# Patient Record
Sex: Female | Born: 1965 | Race: White | Hispanic: No | Marital: Married | Smoking: Former smoker
Health system: Southern US, Academic
[De-identification: ages and names within clinical notes are randomized; demographics above are authoritative.]

## PROBLEM LIST (undated history)

## (undated) DIAGNOSIS — M5126 Other intervertebral disc displacement, lumbar region: Secondary | ICD-10-CM

## (undated) DIAGNOSIS — C801 Malignant (primary) neoplasm, unspecified: Secondary | ICD-10-CM

## (undated) DIAGNOSIS — F32A Depression, unspecified: Secondary | ICD-10-CM

## (undated) DIAGNOSIS — F329 Major depressive disorder, single episode, unspecified: Secondary | ICD-10-CM

## (undated) DIAGNOSIS — F419 Anxiety disorder, unspecified: Secondary | ICD-10-CM

## (undated) DIAGNOSIS — F988 Other specified behavioral and emotional disorders with onset usually occurring in childhood and adolescence: Secondary | ICD-10-CM

## (undated) HISTORY — DX: Depression, unspecified: F32.A

## (undated) HISTORY — DX: Anxiety disorder, unspecified: F41.9

## (undated) HISTORY — DX: Other specified behavioral and emotional disorders with onset usually occurring in childhood and adolescence: F98.8

## (undated) HISTORY — DX: Other intervertebral disc displacement, lumbar region: M51.26

---
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
## 2013-05-31 ENCOUNTER — Inpatient Hospital Stay (HOSPITAL_COMMUNITY)
Admit: 2013-05-31 | Discharge: 2013-05-31 | Disposition: A | Discharge status: Home / Self Care | Payer: Self-pay | Admitting: Radiology

## 2016-02-03 ENCOUNTER — Inpatient Hospital Stay (HOSPITAL_COMMUNITY)
Admit: 2016-02-03 | Discharge: 2016-02-03 | Disposition: A | Discharge status: Home / Self Care | Payer: Self-pay | Admitting: Radiology

## 2017-02-05 ENCOUNTER — Inpatient Hospital Stay (HOSPITAL_COMMUNITY)
Admit: 2017-02-05 | Discharge: 2017-02-05 | Disposition: A | Discharge status: Home / Self Care | Payer: Self-pay | Admitting: Radiology

## 2018-02-08 ENCOUNTER — Inpatient Hospital Stay (HOSPITAL_COMMUNITY)
Admit: 2018-02-08 | Discharge: 2018-02-08 | Disposition: A | Discharge status: Home / Self Care | Payer: Self-pay | Admitting: Radiology

## 2018-11-28 ENCOUNTER — Ambulatory Visit (INDEPENDENT_AMBULATORY_CARE_PROVIDER_SITE_OTHER): Payer: BLUE CROSS/BLUE SHIELD | Admitting: FAMILY PRACTICE

## 2018-11-28 ENCOUNTER — Other Ambulatory Visit: Payer: Self-pay

## 2018-11-28 ENCOUNTER — Encounter (INDEPENDENT_AMBULATORY_CARE_PROVIDER_SITE_OTHER): Payer: Self-pay | Admitting: FAMILY PRACTICE

## 2018-11-28 VITALS — BP 90/60 | HR 77 | Temp 97.1°F | Resp 18 | Ht 62.6 in | Wt 136.9 lb

## 2018-11-28 DIAGNOSIS — F419 Anxiety disorder, unspecified: Secondary | ICD-10-CM

## 2018-11-28 DIAGNOSIS — Z1231 Encounter for screening mammogram for malignant neoplasm of breast: Secondary | ICD-10-CM

## 2018-11-28 DIAGNOSIS — F329 Major depressive disorder, single episode, unspecified: Secondary | ICD-10-CM | POA: Insufficient documentation

## 2018-11-28 DIAGNOSIS — F988 Other specified behavioral and emotional disorders with onset usually occurring in childhood and adolescence: Secondary | ICD-10-CM

## 2018-11-28 DIAGNOSIS — F32A Depression, unspecified: Secondary | ICD-10-CM | POA: Insufficient documentation

## 2018-11-28 MED ORDER — METHYLPHENIDATE ER 18 MG TABLET,EXTENDED RELEASE 24 HR
18.00 mg | EXTENDED_RELEASE_TABLET | Freq: Every morning | ORAL | 0 refills | Status: DC
Start: 2018-11-28 — End: 2018-12-26

## 2018-11-28 NOTE — Patient Instructions (Signed)
Aleve 1 pill twice a day for arthritis with food

## 2018-11-28 NOTE — Progress Notes (Signed)
OUTPATIENT PROGRESS NOTE    Subjective:   Patient ID:  April Cordova is a pleasant 53 y.o. female.    Chief Complaint: Establish Care (Patient is wanting to establish care. She is relocating to this area. Patient is up to date on labs. Will have records transferred)      History of Present Illness:  1. ADD (attention deficit disorder)  For years helps concentration      2. Depression, unspecified depression type  Wellbutrin for 2 yrs  Going through divorce  Prozac for PMS currently off    3. Anxiety  Off Prozac now    4. Screening mammogram, encounter for    - Mammogram: Screening Bilateral; Future  - Mammogram: Screening Bilateral               The history is provided by the patient.      Allergies:   No Known Allergies      Medications:   buPROPion (WELLBUTRIN XL) 300 mg extended release 24 hr tablet, Take 300 mg by mouth Once a day  FLUoxetine (PROZAC) 10 mg Oral Capsule, Take 10 mg by mouth Once a day  methylphenidate HCl 18 mg Oral Tablet Extended Rel 24 hr (2), Take 18 mg by mouth Every morning    No facility-administered medications prior to visit.         Immunization History:     There is no immunization history on file for this patient.      Past Medical History:     Past Medical History:   Diagnosis Date   . ADD (attention deficit disorder)    . Anxiety    . Depression              Past Surgical History:   Past Surgical History was reviewed and is negative for none          Family History:     Family Medical History:     Problem Relation (Age of Onset)    Alcohol abuse Brother    Alzheimer's/Dementia Mother    Breast Cancer Sister    Cancer Father    Drug Abuse Brother                Social History:   Tanina Rakoczy  reports that she has quit smoking. She has never used smokeless tobacco. She reports current alcohol use of about 4 glasses of wine per week. She reports previously being sexually active.      Review of Systems: In addition to HPI  Review of Systems   Constitutional: Negative for chills, diaphoresis  and fever.   HENT: Negative for ear pain and nosebleeds.    Eyes: Negative for double vision and discharge.   Respiratory: Negative for hemoptysis.    Cardiovascular: Negative for chest pain and palpitations.   Gastrointestinal: Negative for blood in stool and melena.   Genitourinary: Negative for hematuria.        Rehgular periods   Musculoskeletal: Negative for myalgias.   Skin: Negative for rash.   Neurological: Negative for tremors, speech change and focal weakness.   Endo/Heme/Allergies: Does not bruise/bleed easily.   Psychiatric/Behavioral: Negative for hallucinations and memory loss.   All other systems reviewed and are negative.        Objective:   Vitals:    Vitals:    11/28/18 0949   BP: 90/60   Pulse: 77   Resp: 18   Temp: 36.2 C (97.1 F)   TempSrc: Thermal Scan  SpO2: 97%   Weight: 62.1 kg (136 lb 14.5 oz)   Height: 1.59 m (5' 2.6")   BMI: 24.62         Wt Readings from Last 3 Encounters:   11/28/18 62.1 kg (136 lb 14.5 oz)      Body mass index is 24.56 kg/m.  Physical Exam   Constitutional: She is oriented to person, place, and time and well-developed, well-nourished, and in no distress.   HENT:   Head: Normocephalic and atraumatic.   Mouth/Throat: Oropharynx is clear and moist.   Eyes: Pupils are equal, round, and reactive to light.   Neck: Neck supple. No thyromegaly present.   Cardiovascular: Normal rate and regular rhythm.   No murmur heard.  Pulmonary/Chest: Effort normal and breath sounds normal.   Abdominal: Soft. Bowel sounds are normal.   Musculoskeletal: Normal range of motion.         General: No deformity.   Lymphadenopathy:     She has no cervical adenopathy.   Neurological: She is alert and oriented to person, place, and time. No cranial nerve deficit.   Skin: Skin is warm and dry.   Nursing note and vitals reviewed.      POCT Results:                   I have reviewed and interpreted  the point of care( POCT) results in this note and used this information in my assessment.  Interperation: None performed Today  Virgel Gess, MD  11/28/2018, 10:23      There are no exam notes on file for this visit.    Assessment & Plan:     1. ADD (attention deficit disorder)  ASSESSMENT  OF PROBLEM  Controlled and Stable        2. Depression, unspecified depression type  ASSESSMENT OF DEPRESSION  ASSESSMENT  OF PROBLEM  Controlled and Stable    PLAN  Pt will continue medication and or counseling.  Call if you get more depressed or suicidal.  Call if any side effects of medicine.        3. Anxiety  Chronic problem, Stable without significant change. Continue current treatment and call if any changes.      4. Screening mammogram, encounter for    - Mammogram: Screening Bilateral; Future  - Mammogram: Screening Bilateral          Depression screening follow up plan of care: Follow up 3 mos cont meds.no SI      Health Maintenance   Topic Date Due   . Depression Screening  06/14/1979   . HIV Screening  06/14/1982   . Adult Tdap-Td (1 - Tdap) 06/14/1986   . Pap smear  06/13/1988   . Mammography  06/13/2017   . Shingles Vaccine (1 of 2) 06/13/2017   . Colonoscopy  06/13/2017   . Influenza Vaccine (1) 09/20/2018     Covid-19 Instructions and Precautions  Stay home and continue to isolate  Call the office if you are sick to get instructions.  Practice social distancing  Avoid touching eyes, nose, and mouth  Clean and disinfect surfaces  Wash your hands often with soap and water for 20 seconds  Call if any fever, increased SOB, cough or body aches   Return if symptoms worsen or fail to improve.  Return in about 3 months (around 02/28/2019).  Virgel Gess, MD 11/28/2018 10:23

## 2018-12-26 ENCOUNTER — Other Ambulatory Visit (INDEPENDENT_AMBULATORY_CARE_PROVIDER_SITE_OTHER): Payer: Self-pay | Admitting: FAMILY PRACTICE

## 2018-12-26 ENCOUNTER — Other Ambulatory Visit (INDEPENDENT_AMBULATORY_CARE_PROVIDER_SITE_OTHER): Payer: Self-pay

## 2018-12-26 NOTE — Telephone Encounter (Signed)
Regarding: rx refill  ----- Message from Ronda sent at 12/26/2018  9:43 AM EST -----  Doctor Name:  Dr. Kingsley Spittle      Date of last appointment: 11/28/18    Next scheduled visit: 02/28/19    Medication Requested:   methylphenidate HCl 18 mg Oral Tablet Extended Rel 24 hr (2) 30 Tab 0 11/28/2018 12/28/2018   Sig - Route: Take 1 Tab (18 mg total) by mouth Every morning for 30 days - Oral     Preferred Pharmacy     [id number] - [place], [place] - [place]    [id number] [place] [address]    Phone: [phone] Fax: [phone]    Not a 24 hour pharmacy; exact hours not known.

## 2018-12-26 NOTE — Telephone Encounter (Signed)
RX REFILL:    Last Labs:no recent visits.  Last Office Visit:11/28/18  April Cordova  12/26/2018, 16:03

## 2018-12-27 MED ORDER — METHYLPHENIDATE ER 18 MG TABLET,EXTENDED RELEASE 24 HR
18.00 mg | EXTENDED_RELEASE_TABLET | Freq: Every morning | ORAL | 0 refills | Status: DC
Start: 2018-12-27 — End: 2019-01-27

## 2018-12-27 NOTE — Nursing Note (Signed)
Quentin Mulling, Ambulatory Care Assistant  12/27/2018, 08:22    Patient requesting a refill on a controlled medication:    Has the patient completed a visit within the past 90 days? Yes  Is there an active pain agreement on file? No  Has a urine drug screen been completed in the last 12 months? No    Board of Pharmacy has been reviewed and a snip it has been entered in the note.     Last scheduled appointment with you was 11/28/2018.  Currently scheduled future appointment is 02/28/2019.    Quentin Mulling, Ambulatory Care Assistant  12/27/2018, 08:22      New patient to [facility]. Will get contract and urine drug screen done at next visit.  Quentin Mulling, Ambulatory Care Assistant  12/27/2018, 08:22

## 2019-01-19 ENCOUNTER — Encounter (INDEPENDENT_AMBULATORY_CARE_PROVIDER_SITE_OTHER): Payer: Self-pay | Admitting: FAMILY PRACTICE

## 2019-01-23 ENCOUNTER — Ambulatory Visit
Admission: RE | Admit: 2019-01-23 | Discharge: 2019-01-23 | Disposition: A | Discharge status: Home / Self Care | Payer: BLUE CROSS/BLUE SHIELD | Source: Ambulatory Visit | Attending: FAMILY PRACTICE | Admitting: FAMILY PRACTICE

## 2019-01-23 ENCOUNTER — Encounter (INDEPENDENT_AMBULATORY_CARE_PROVIDER_SITE_OTHER): Payer: BLUE CROSS/BLUE SHIELD | Admitting: FAMILY PRACTICE

## 2019-01-23 ENCOUNTER — Other Ambulatory Visit: Payer: Self-pay

## 2019-01-23 ENCOUNTER — Encounter (HOSPITAL_COMMUNITY): Payer: Self-pay

## 2019-01-23 DIAGNOSIS — Z1231 Encounter for screening mammogram for malignant neoplasm of breast: Secondary | ICD-10-CM | POA: Insufficient documentation

## 2019-01-23 HISTORY — DX: Malignant (primary) neoplasm, unspecified: C80.1

## 2019-01-26 ENCOUNTER — Telehealth (INDEPENDENT_AMBULATORY_CARE_PROVIDER_SITE_OTHER): Payer: Self-pay | Admitting: FAMILY PRACTICE

## 2019-01-26 NOTE — Telephone Encounter (Signed)
Department of Community Practice     Attempted to contact patient to complete COVID-19 prescreen prior to scheduled appointment. Unable to reach patient at this time, left message for patient to call back in and complete prescreening prior to appointment. Call back number provided [facility] [phone].    Drue Second  01/26/2019, 09:26

## 2019-01-27 ENCOUNTER — Encounter (INDEPENDENT_AMBULATORY_CARE_PROVIDER_SITE_OTHER): Payer: Self-pay | Admitting: FAMILY PRACTICE

## 2019-01-27 ENCOUNTER — Other Ambulatory Visit: Payer: Self-pay

## 2019-01-27 ENCOUNTER — Ambulatory Visit (INDEPENDENT_AMBULATORY_CARE_PROVIDER_SITE_OTHER): Payer: BLUE CROSS/BLUE SHIELD | Admitting: FAMILY PRACTICE

## 2019-01-27 VITALS — BP 120/60 | HR 91 | Temp 97.0°F | Resp 18 | Wt 138.9 lb

## 2019-01-27 DIAGNOSIS — F32A Depression, unspecified: Secondary | ICD-10-CM

## 2019-01-27 DIAGNOSIS — F419 Anxiety disorder, unspecified: Secondary | ICD-10-CM

## 2019-01-27 DIAGNOSIS — F329 Major depressive disorder, single episode, unspecified: Secondary | ICD-10-CM

## 2019-01-27 DIAGNOSIS — F988 Other specified behavioral and emotional disorders with onset usually occurring in childhood and adolescence: Secondary | ICD-10-CM

## 2019-01-27 NOTE — Progress Notes (Signed)
OUTPATIENT PROGRESS NOTE    Subjective:   Patient ID:  April Cordova is a pleasant 54 y.o. female.    Chief Complaint: ADHD (Patient wanting to discuss possibly switching Concerta to Medical Marijuana) and Anxiety (follow-up)      History of Present Illness:  1. Attention deficit disorder, unspecified hyperactivity presence  Wants to consider med changes    2. Depression, unspecified depression type  Going through stress divorce  Meds reduce cognition  No hx drug abuse    3. Anxiety  Worse lately               The history is provided by the patient.      Allergies:   No Known Allergies      Medications:     .  buPROPion (WELLBUTRIN XL) 300 mg extended release 24 hr tablet, Take 300 mg by mouth Once a day    .  FLUoxetine (PROZAC) 10 mg Oral Capsule, Take 10 mg by mouth Once a day    .  methylphenidate HCl 18 mg Oral Tablet Extended Rel 24 hr (2), Take 1 Tab (18 mg total) by mouth Every morning for 30 days    .  methylphenidate HCl 18 mg Oral Tablet Extended Rel 24 hr (2), Take 1 Tab (18 mg total) by mouth Every morning for 30 days    No facility-administered medications prior to visit.         Immunization History:     There is no immunization history on file for this patient.      Past Medical History:     Past Medical History:   Diagnosis Date   . ADD (attention deficit disorder)    . Anxiety    . Cancer (CMS HCC)     basal cell ca   head and shoulders   . Depression              Past Surgical History:   Past Surgical History was reviewed and is negative for none          Family History:     Family Medical History:     Problem Relation (Age of Onset)    Alcohol abuse Brother    Alzheimer's/Dementia Mother    Breast Cancer Sister    Cancer Father    Drug Abuse Brother                Social History:   April Cordova  reports that she has quit smoking. She has never used smokeless tobacco. She reports current alcohol use of about 4 glasses of wine per week. She reports previously being sexually active.      Review of  Systems: In addition to HPI  Review of Systems   Constitutional: Negative for chills, diaphoresis and fever.   HENT: Negative for ear pain and nosebleeds.    Eyes: Negative for double vision and discharge.   Respiratory: Negative for hemoptysis.    Cardiovascular: Negative for chest pain and palpitations.   Gastrointestinal: Negative for blood in stool and melena.   Genitourinary: Negative for hematuria.   Musculoskeletal: Negative for myalgias.   Skin: Negative for rash.   Neurological: Negative for tremors, speech change and focal weakness.   Endo/Heme/Allergies: Does not bruise/bleed easily.   Psychiatric/Behavioral: Positive for depression. Negative for hallucinations, memory loss, substance abuse and suicidal ideas. The patient is nervous/anxious.    All other systems reviewed and are negative.        Objective:  Vitals:    Vitals:    01/27/19 1129   BP: 120/60   Pulse: 91   Resp: 18   Temp: 36.1 C (97 F)   TempSrc: Thermal Scan   SpO2: 99%   Weight: 63 kg (138 lb 14.2 oz)         Wt Readings from Last 3 Encounters:   01/27/19 63 kg (138 lb 14.2 oz)   11/28/18 62.1 kg (136 lb 14.5 oz)      Body mass index is 24.92 kg/m.  Physical Exam   Constitutional: She is oriented to person, place, and time and well-developed, well-nourished, and in no distress.   HENT:   Head: Normocephalic and atraumatic.   Mouth/Throat: Oropharynx is clear and moist.   Eyes: Pupils are equal, round, and reactive to light.   Neck: Neck supple. No thyromegaly present.   Cardiovascular: Normal rate and regular rhythm.   No murmur heard.  Pulmonary/Chest: Effort normal and breath sounds normal.   Abdominal: Soft. Bowel sounds are normal.   Musculoskeletal: Normal range of motion.         General: No deformity.   Lymphadenopathy:     She has no cervical adenopathy.   Neurological: She is alert and oriented to person, place, and time. No cranial nerve deficit.   Skin: Skin is warm and dry.   Psychiatric: Memory, affect and judgment normal.    Nursing note and vitals reviewed.      POCT Results:                   I have reviewed and interpreted  the point of care( POCT) results in this note and used this information in my assessment. Interperation: None performed Today  Virgel Gess, MD  01/27/2019, 11:37      There are no exam notes on file for this visit.    Assessment & Plan:     1. Attention deficit disorder, unspecified hyperactivity presence  ASSESSMENT  OF PROBLEM  High Complexity due to multiple comorbidities, Acute, Chronic, Worsening, Moderate Severity and refer to psych [facility]        2. Depression, unspecified depression type  No SI    3. Anxiety  worsening                Health Maintenance   Topic Date Due   . HIV Screening  06/14/1982   . Adult Tdap-Td (1 - Tdap) 06/14/1986   . Pap smear  06/13/1988   . Mammography  06/13/2017   . Shingles Vaccine (1 of 2) 06/13/2017   . Colonoscopy  06/13/2017   . Influenza Vaccine  Completed     Return if symptoms worsen or fail to improve.    Covid-19 Instructions and Precautions    Get the Covid Vaccine as soon as you hear it is available.  It may be available at pharmacies and Health Department before we have it in office.  Stay home and continue to isolate  Use mask whenever outside your home.  Call the office if you are sick to get instructions.  Practice social distancing  Avoid touching eyes, nose, and mouth  Clean and disinfect surfaces  Wash your hands often with soap and water for 20 seconds  Call if any fever, increased SOB, cough or body aches     No follow-ups on file.  Virgel Gess, MD 01/27/2019 11:37

## 2019-02-03 ENCOUNTER — Other Ambulatory Visit (HOSPITAL_COMMUNITY): Payer: Self-pay

## 2019-02-06 ENCOUNTER — Other Ambulatory Visit (INDEPENDENT_AMBULATORY_CARE_PROVIDER_SITE_OTHER): Payer: Self-pay | Admitting: FAMILY PRACTICE

## 2019-02-06 DIAGNOSIS — R928 Other abnormal and inconclusive findings on diagnostic imaging of breast: Secondary | ICD-10-CM

## 2019-02-21 ENCOUNTER — Ambulatory Visit
Admission: RE | Admit: 2019-02-21 | Discharge: 2019-02-21 | Disposition: A | Discharge status: Home / Self Care | Payer: BLUE CROSS/BLUE SHIELD | Source: Ambulatory Visit | Attending: FAMILY PRACTICE | Admitting: FAMILY PRACTICE

## 2019-02-21 ENCOUNTER — Encounter (HOSPITAL_COMMUNITY): Payer: Self-pay

## 2019-02-21 ENCOUNTER — Other Ambulatory Visit: Payer: Self-pay

## 2019-02-21 DIAGNOSIS — N6489 Other specified disorders of breast: Secondary | ICD-10-CM | POA: Insufficient documentation

## 2019-02-21 DIAGNOSIS — R928 Other abnormal and inconclusive findings on diagnostic imaging of breast: Secondary | ICD-10-CM

## 2019-02-27 ENCOUNTER — Telehealth (INDEPENDENT_AMBULATORY_CARE_PROVIDER_SITE_OTHER): Payer: Self-pay | Admitting: FAMILY PRACTICE

## 2019-02-27 NOTE — Telephone Encounter (Signed)
Called patient prior to appointment to prescreen for COVID-19.      Have you had new or worsened shortness of breath in the past 14 days?  No  Have you had a new or worsening cough in the past 14 days?  No  Have you had a fever in the past 14 days?  No  Have you experienced a loss of taste or smell in the past 14 days? No  Have you experienced headache with nausea in the past 14 days? No    Patient or patients guardian/attendant has a Negative Prescreen.      Patient informed of visitor policy at this time.    Informed patient that we are requesting all patients and visitors wear a mask when entering the clinic.     Appointment notes have been updated to reflect screening.    Patient instructed to present to the clinic for scheduled appointment.    [place]  02/27/2019, 09:10

## 2019-02-28 ENCOUNTER — Ambulatory Visit (INDEPENDENT_AMBULATORY_CARE_PROVIDER_SITE_OTHER): Payer: BLUE CROSS/BLUE SHIELD | Admitting: FAMILY PRACTICE

## 2019-02-28 ENCOUNTER — Encounter (INDEPENDENT_AMBULATORY_CARE_PROVIDER_SITE_OTHER): Payer: Self-pay | Admitting: FAMILY PRACTICE

## 2019-02-28 ENCOUNTER — Ambulatory Visit (INDEPENDENT_AMBULATORY_CARE_PROVIDER_SITE_OTHER): Payer: Self-pay | Admitting: FAMILY PRACTICE

## 2019-02-28 ENCOUNTER — Other Ambulatory Visit: Payer: Self-pay

## 2019-02-28 VITALS — BP 102/60 | HR 85 | Temp 98.5°F | Resp 18 | Wt 142.4 lb

## 2019-02-28 DIAGNOSIS — Z0000 Encounter for general adult medical examination without abnormal findings: Secondary | ICD-10-CM

## 2019-02-28 LAB — HM PAP SMEAR

## 2019-02-28 MED ORDER — METHYLPHENIDATE ER 18 MG TABLET,EXTENDED RELEASE 24 HR
18.00 mg | EXTENDED_RELEASE_TABLET | Freq: Every morning | ORAL | 0 refills | Status: DC
Start: 2019-02-28 — End: 2019-04-24

## 2019-02-28 NOTE — Nursing Note (Signed)
Patient has an appointment on 02/28/2019---Will discuss then.  Quentin Mulling, Ambulatory Care Assistant  02/28/2019, 09:17

## 2019-02-28 NOTE — Progress Notes (Signed)
[facility], [place]  [address]  [place] [address]  [facility]   Annual Preventive Health Care Visit    Name: April Cordova MRN:  [id number]   Date: 02/28/2019 Age: 54 y.o.       SUBJECTIVE:   April Cordova is a 54 y.o. female for presenting for  Annual Preventive Health Care Visit and Examination.   Chronic Medical Problems addressed today:  1. Annual physical exam    - PAP IG, RFX HPV ALL PTH; Future  - PAP IG, RFX HPV ALL PTH      I have reviewed and reconciled the medication list with the patient today.    I have reviewed and updated as appropriate the past medical, family and social history. 02/28/2019 as summarized below:  Past Medical History:   Diagnosis Date   . ADD (attention deficit disorder)    . Anxiety    . Cancer (CMS HCC)     basal cell ca   head and shoulders   . Depression      History reviewed. No pertinent surgical history.  Current Outpatient Medications   Medication Sig   . buPROPion (WELLBUTRIN XL) 300 mg extended release 24 hr tablet Take 300 mg by mouth Once a day   . methylphenidate HCl 18 mg Oral Tablet Extended Rel 24 hr (2) Take 1 Tablet (18 mg total) by mouth Every morning     Family Medical History:     Problem Relation (Age of Onset)    Alcohol abuse Brother    Alzheimer's/Dementia Mother    Breast Cancer Sister    Cancer Father    Drug Abuse Brother            Social History     Socioeconomic History   . Marital status: Other     Spouse name: Not on file   . Number of children: Not on file   . Years of education: Not on file   . Highest education level: Not on file   Tobacco Use   . Smoking status: Former Research scientist (life sciences)   . Smokeless tobacco: Never Used   Substance and Sexual Activity   . Alcohol use: Yes     Alcohol/week: 4.0 standard drinks     Types: 4 Glasses of wine per week   . Sexual activity: Not Currently     Social Determinants of Health     Financial Resource Strain:    . Difficulty of Paying Living Expenses: Not on file   Food Insecurity:    . Worried About  Charity fundraiser in the Last Year: Not on file   . Ran Out of Food in the Last Year: Not on file   Transportation Needs:    . Lack of Transportation (Medical): Not on file   . Lack of Transportation (Non-Medical): Not on file   Physical Activity:    . Days of Exercise per Week: Not on file   . Minutes of Exercise per Session: Not on file   Stress:    . Feeling of Stress : Not on file   Intimate Partner Violence:    . Fear of Current or Ex-Partner: Not on file   . Emotionally Abused: Not on file   . Physically Abused: Not on file   . Sexually Abused: Not on file     Review of Systems:   Review of Systems   Constitutional: Negative for chills, diaphoresis and fever.   HENT: Negative for ear  pain and nosebleeds.    Eyes: Negative for double vision and discharge.   Respiratory: Negative for hemoptysis.    Cardiovascular: Negative for chest pain and palpitations.   Gastrointestinal: Negative for blood in stool and melena.   Genitourinary: Negative for hematuria.   Musculoskeletal: Negative for myalgias.   Skin: Negative for rash.   Neurological: Negative for tremors, speech change and focal weakness.        ADHD under control   Endo/Heme/Allergies: Does not bruise/bleed easily.   Psychiatric/Behavioral: Negative for hallucinations and memory loss.   All other systems reviewed and are negative.      Health Maintenance   Topic Date Due   . HIV Screening  06/14/1982   . Adult Tdap-Td (1 - Tdap) 06/14/1986   . Pap smear  06/13/1988   . Shingles Vaccine (1 of 2) 06/13/2017   . Colonoscopy  06/13/2017   . Mammography  01/22/2021   . Influenza Vaccine  Completed       OBJECTIVE:   BP 102/60   Pulse 85   Temp 36.9 C (98.5 F) (Thermal Scan)   Resp 18   Wt 64.6 kg (142 lb 6.7 oz)   SpO2 98%   BMI 25.55 kg/m        Physical Exam:  Physical Exam  Vitals and nursing note reviewed.   HENT:      Head: Normocephalic and atraumatic.   Eyes:      Pupils: Pupils are equal, round, and reactive to light.   Neck:      Thyroid: No  thyromegaly.   Cardiovascular:      Rate and Rhythm: Normal rate and regular rhythm.      Heart sounds: No murmur.   Pulmonary:      Effort: Pulmonary effort is normal.      Breath sounds: Normal breath sounds.   Abdominal:      General: Bowel sounds are normal.      Palpations: Abdomen is soft.   Genitourinary:     General: Normal vulva.      Vagina: No vaginal discharge.      Rectum: Normal.   Musculoskeletal:         General: No deformity. Normal range of motion.      Cervical back: Neck supple.   Lymphadenopathy:      Cervical: No cervical adenopathy.   Skin:     General: Skin is warm and dry.   Neurological:      Mental Status: She is alert and oriented to person, place, and time.      Cranial Nerves: No cranial nerve deficit.   Psychiatric:         Mood and Affect: Affect normal.         Cognition and Memory: Memory normal.         Judgment: Judgment normal.           Urine Dip Results:        Blood  Results:         Rapid Strep Results:         Rapid Flu       I have reviewed and interpreted  the point of care( POCT) results in this note and used this information in my assessment. Interperation: None performed Today  Virgel Gess, MD  02/28/2019, 10:00            Health Maintenance Due   Topic Date Due   . HIV Screening  06/14/1982   .  Adult Tdap-Td (1 - Tdap) 06/14/1986   . Pap smear  06/13/1988   . Shingles Vaccine (1 of 2) 06/13/2017   . Colonoscopy  06/13/2017        ASSESSMENT & PLAN:   1. Annual physical exam  Normal exam  ADHD controlled.consult ordered     Identified Risk Factors/ Recommended Actions   BMI addressed: Advised on diet, weight loss, and exercise to reduce above normal BMI.          Orders Placed This Encounter   . PAP IG, RFX HPV ALL PTH   . methylphenidate HCl 18 mg Oral Tablet Extended Rel 24 hr (2)        The patient has been evaluated today. Screening has been done and abnormalities discussed. All immunizations have been brought up to date unless pt refused or has a medical  contraindication. The patient's questions have been answered and follow-up has been arranged. Medicare wellness parameters have been evaluated and addressed if applicable. Routine follow-up has been arranged for chronic medical conditions and any new acute medical concerns addressed today.  The patient has been educated about risk factors and recommended preventive care. Written Prevention Plan completed/ updated and given to patient (see After Visit Summary).    No follow-ups on file.    Virgel Gess, MD  [place], [facility]  [place] [address]  [phone]

## 2019-02-28 NOTE — Nursing Note (Signed)
Quentin Mulling, Ambulatory Care Assistant  02/28/2019, 09:40    Patient requesting a refill on a controlled medication:    Has the patient completed a visit within the past 90 days? Yes  Is there an active pain agreement on file? Yes  Has a urine drug screen been completed in the last 12 months? No    Board of Pharmacy has been reviewed and a snip it has been entered in the note.     Last scheduled appointment with you was 01/27/2019.  Currently scheduled future appointment is Visit date not found.    Quentin Mulling, Ambulatory Care Assistant  02/28/2019, 09:40        Pap/HPV collection was performed in office. Patient tolerated the procedure well. Specimen was labeled and packaged for transport to [place], Ambulatory Care Assistant  02/28/2019, 09:45

## 2019-02-28 NOTE — Telephone Encounter (Signed)
-----   Message from Darius Bump sent at 02/08/2019 11:03 AM EST -----  Regarding: FW: status on mammo  ----- Message from [place] sent at 02/07/2019  4:58 PM EST -----  Virgel Gess, MD    Pt is calling regarding the status of her mammo order.  It looks like it was placed yesterday.  Does her insurance need to approve it first?  Please call pt to discuss [phone]

## 2019-03-08 ENCOUNTER — Encounter (INDEPENDENT_AMBULATORY_CARE_PROVIDER_SITE_OTHER): Payer: Self-pay | Admitting: FAMILY PRACTICE

## 2019-03-10 ENCOUNTER — Encounter (INDEPENDENT_AMBULATORY_CARE_PROVIDER_SITE_OTHER): Payer: Self-pay | Admitting: FAMILY PRACTICE

## 2019-04-24 ENCOUNTER — Other Ambulatory Visit (INDEPENDENT_AMBULATORY_CARE_PROVIDER_SITE_OTHER): Payer: Self-pay | Admitting: FAMILY PRACTICE

## 2019-04-25 ENCOUNTER — Other Ambulatory Visit (INDEPENDENT_AMBULATORY_CARE_PROVIDER_SITE_OTHER): Payer: Self-pay | Admitting: FAMILY PRACTICE

## 2019-04-25 MED ORDER — BUPROPION HCL XL 300 MG 24 HR TABLET, EXTENDED RELEASE
300.0000 mg | ORAL_TABLET | Freq: Every day | ORAL | 1 refills | Status: DC
Start: 2019-04-25 — End: 2019-10-16

## 2019-04-25 MED ORDER — METHYLPHENIDATE ER 18 MG TABLET,EXTENDED RELEASE 24 HR
18.00 mg | EXTENDED_RELEASE_TABLET | Freq: Every morning | ORAL | 0 refills | Status: DC
Start: 2019-04-25 — End: 2019-04-25

## 2019-04-25 MED ORDER — METHYLPHENIDATE ER 18 MG TABLET,EXTENDED RELEASE 24 HR
18.00 mg | EXTENDED_RELEASE_TABLET | Freq: Every morning | ORAL | 0 refills | Status: DC
Start: 2019-04-25 — End: 2019-06-02

## 2019-04-25 NOTE — Telephone Encounter (Signed)
RX REFILL:    Last Labs:02/28/19  Last Office Visit:02/28/19  April Cordova  04/25/2019, 14:04

## 2019-04-25 NOTE — Telephone Encounter (Signed)
-----   Message from Sandria Senter, [facility] sent at 04/25/2019 12:54 PM EDT -----  Virgel Gess, MD    Refill. Please call. Thanks     buPROPion (WELLBUTRIN XL) 300 mg extended release 24 hr tablet  Concerta     Preferred [place], [place]    [place] [address]    Phone: [phone] Fax: [phone]    Hours: Not open 24 hours

## 2019-06-01 ENCOUNTER — Other Ambulatory Visit (INDEPENDENT_AMBULATORY_CARE_PROVIDER_SITE_OTHER): Payer: Self-pay | Admitting: FAMILY PRACTICE

## 2019-06-01 NOTE — Telephone Encounter (Signed)
RX REFILL:    Last Labs:  Last Office Visit:02/28/19  April Cordova  06/01/2019, 11:59

## 2019-06-01 NOTE — Telephone Encounter (Signed)
Patient needs to be seen for Med Compliance. (ADHD med)  She states she will be on vacation after Sat, I don't see where we can fit her in anywhere. ADVISE??  Samuel Bouche  06/01/2019, 12:04

## 2019-06-02 MED ORDER — METHYLPHENIDATE ER 18 MG TABLET,EXTENDED RELEASE 24 HR
18.00 mg | EXTENDED_RELEASE_TABLET | Freq: Every morning | ORAL | 0 refills | Status: DC
Start: 2019-06-02 — End: 2019-07-06

## 2019-06-02 NOTE — Telephone Encounter (Signed)
Regarding: Rx request  ----- Message from Lanetta Inch sent at 06/02/2019  2:45 PM EDT -----  Doctor Name:  Virgel Gess, MD    ##pt is leaving Saturday afternoon, and is requesting this medication before she leaves.          Date of last appointment:  02/28/2019    Next scheduled visit: 08/29/2019    Medication Requested:     methylphenidate HCl 18 mg Oral Tablet Extended Rel 24 hr    Electronically signed by: Virgel Gess, MD on 04/25/19 1520  Ordering user: Virgel Gess, MD 04/25/19 1520 Authorized by: Virgel Gess, MD  Ordering mode: Standard          Preferred Pharmacy       [place] - [address]    [place] [address]    Phone: [phone] Fax: [phone]    Hours: Not open 24 hours

## 2019-06-02 NOTE — Telephone Encounter (Signed)
Patient needed seen for Med compliance. She asked to be worked in this week and we were unable to get her into the office. She is leaving for [employer]. ADVISE?    Last Labs:no recent.  Last Office Visit:03/28/19  April Cordova  06/02/2019, 15:00  Patient requesting a refill on a controlled medication:    Has the patient completed a visit within the past 90 days? NO  Is there an active pain agreement on file? Yes  Has a urine drug screen been completed in the last 12 months? Yes    Board of Pharmacy has been reviewed and a snip it has been entered in the note.     Last scheduled appointment with you was 02/28/2019.  Currently scheduled future appointment is 08/29/2019.    Samuel Bouche  06/02/2019, 15:02      INSERT BOARD OF PHARMACY

## 2019-06-23 ENCOUNTER — Telehealth (INDEPENDENT_AMBULATORY_CARE_PROVIDER_SITE_OTHER): Payer: Self-pay | Admitting: FAMILY PRACTICE

## 2019-06-23 NOTE — Telephone Encounter (Signed)
Called patient prior to appointment to prescreen for COVID-19.      Have you experienced any of the following symptoms in the past 48 hours?    Fever or Chills No  Cough No  Shortness of Breath or difficulty breathing No  Fatigue No  Muscle or Body Aches No  Headache No  New loss of taste or smell No  Sore Throat No  Congestion or Runny Nose No  Nausea or Vomiting No  Diarrhea No    Within the past 14 days, have you been in close physical contact (6 feet or closer for at least 15 minutes) with a person who is known to have laboratory confirmed COVID-19 or with anyone who has any symptoms consistent with COVID-19? No    Are you isolating or quarantining because you may have been exposed to a person with COVID-19 or are worried that you may be sick with COVID-19? No    Are you currently waiting on the results of a COVID-19 test? No    Patient or patients guardian/attendant has a Negative Prescreen.      Patient informed of visitor policy at this time.    Informed patient that we are requesting all patients and visitors wear a mask when entering the clinic.     Appointment notes have been updated to reflect screening.    April Cordova  06/23/2019, 09:34

## 2019-06-26 ENCOUNTER — Encounter (INDEPENDENT_AMBULATORY_CARE_PROVIDER_SITE_OTHER): Payer: BLUE CROSS/BLUE SHIELD | Admitting: FAMILY PRACTICE

## 2019-07-05 ENCOUNTER — Telehealth (INDEPENDENT_AMBULATORY_CARE_PROVIDER_SITE_OTHER): Payer: Self-pay | Admitting: FAMILY PRACTICE

## 2019-07-05 NOTE — Telephone Encounter (Signed)
Department of Community Practice     Attempted to contact patient to complete COVID-19 prescreen prior to scheduled appointment. Unable to reach patient at this time, left message for patient to call back in and complete prescreening prior to appointment. Call back number provided [facility] [phone].    Drue Second  07/05/2019, 09:36

## 2019-07-06 ENCOUNTER — Other Ambulatory Visit: Payer: Self-pay

## 2019-07-06 ENCOUNTER — Encounter (INDEPENDENT_AMBULATORY_CARE_PROVIDER_SITE_OTHER): Payer: Self-pay | Admitting: FAMILY PRACTICE

## 2019-07-06 ENCOUNTER — Encounter (FREE_STANDING_LABORATORY_FACILITY)
Admit: 2019-07-06 | Discharge: 2019-07-06 | Disposition: A | Discharge status: Home / Self Care | Payer: BLUE CROSS/BLUE SHIELD | Attending: FAMILY PRACTICE | Admitting: FAMILY PRACTICE

## 2019-07-06 ENCOUNTER — Encounter (FREE_STANDING_LABORATORY_FACILITY): Payer: BLUE CROSS/BLUE SHIELD | Admitting: FAMILY PRACTICE

## 2019-07-06 ENCOUNTER — Ambulatory Visit (INDEPENDENT_AMBULATORY_CARE_PROVIDER_SITE_OTHER): Payer: BLUE CROSS/BLUE SHIELD | Admitting: FAMILY PRACTICE

## 2019-07-06 VITALS — HR 84 | Temp 97.0°F | Resp 18 | Ht 62.6 in | Wt 141.3 lb

## 2019-07-06 DIAGNOSIS — M542 Cervicalgia: Secondary | ICD-10-CM

## 2019-07-06 DIAGNOSIS — R5383 Other fatigue: Secondary | ICD-10-CM

## 2019-07-06 DIAGNOSIS — F988 Other specified behavioral and emotional disorders with onset usually occurring in childhood and adolescence: Secondary | ICD-10-CM

## 2019-07-06 DIAGNOSIS — M25512 Pain in left shoulder: Secondary | ICD-10-CM

## 2019-07-06 DIAGNOSIS — Z13220 Encounter for screening for lipoid disorders: Secondary | ICD-10-CM

## 2019-07-06 DIAGNOSIS — E559 Vitamin D deficiency, unspecified: Secondary | ICD-10-CM

## 2019-07-06 LAB — THYROID STIMULATING HORMONE WITH FREE T4 REFLEX: TSH: 1.551 u[IU]/mL (ref 0.430–3.550)

## 2019-07-06 MED ORDER — METHYLPHENIDATE ER 18 MG TABLET,EXTENDED RELEASE 24 HR
18.00 mg | EXTENDED_RELEASE_TABLET | Freq: Every morning | ORAL | 0 refills | Status: DC
Start: 2019-07-06 — End: 2019-08-28

## 2019-07-06 NOTE — Nursing Note (Signed)
Department of Community Practice     Venipuncture performed in office on right arm antecubital vein, dry pressure dressing was applied to site and patient tolerated it well.  Specimen was centrifuged, aliquoted as needed and specimen was labeled and packaged for transport.    Pamalee Leyden, Ambulatory Care Assistant  07/06/2019, 11:25

## 2019-07-06 NOTE — Nursing Note (Signed)
Quentin Mulling, Ambulatory Care Assistant  07/06/2019, 10:23    Patient has been out of medication since 05/25/2019

## 2019-07-06 NOTE — Progress Notes (Signed)
OUTPATIENT PROGRESS NOTE    Subjective:   Patient ID:  April Cordova is a pleasant 54 y.o. female.    Chief Complaint: ADD (follow-up    medication compliance) and Neck Pain (sharp shooting pains radiating into left shoulder)      History of Present Illness:  1. Attention deficit disorder, unspecified hyperactivity presence  No probs  welllcontrolled    2. Left shoulder pain  Radiation left neck  - CT CERVICAL SPINE WO IV CONTRAST; Future  - CT CERVICAL SPINE WO IV CONTRAST    3. Lipid screening      4. Fatigue, unspecified type    - THYROID STIMULATING HORMONE WITH FREE T4 REFLEX; Future    5. Vitamin D deficiency      6. Neck pain  3 mos  Moderate getting worse  Positional down to left shoilder  - CT CERVICAL SPINE WO IV CONTRAST; Future  - CT CERVICAL SPINE WO IV CONTRAST               The history is provided by the patient.      Allergies:   No Known Allergies      Medications:   buPROPion (WELLBUTRIN XL) 300 mg extended release 24 hr tablet, Take 1 Tablet (300 mg total) by mouth Once a day  methylphenidate HCl 18 mg Oral Tablet Extended Rel 24 hr, Take 1 Tablet (18 mg total) by mouth Every morning    No facility-administered medications prior to visit.        Immunization History:     Immunization History   Administered Date(s) Administered   . Covid-19 Vaccine,Pfizer-BioNTech 05/31/2019, 06/21/2019         Past Medical History:     Past Medical History:   Diagnosis Date   . ADD (attention deficit disorder)    . Anxiety    . Cancer (CMS HCC)     basal cell ca   head and shoulders   . Depression              Past Surgical History:   Past Surgical History was reviewed and is negative for neg          Family History:     Family Medical History:     Problem Relation (Age of Onset)    Alcohol abuse Brother    Alzheimer's/Dementia Mother    Breast Cancer Sister    Cancer Father    Drug Abuse Brother                Social History:   April Cordova  reports that she has quit smoking. She has never used smokeless tobacco. She  reports current alcohol use of about 4 glasses of wine per week. She reports previously being sexually active.      Review of Systems: In addition to HPI  Review of Systems   Constitutional: Negative for chills, diaphoresis and fever.   HENT: Negative for ear pain and nosebleeds.    Eyes: Negative for double vision and discharge.   Respiratory: Negative for hemoptysis.    Cardiovascular: Negative for chest pain and palpitations.   Gastrointestinal: Negative for blood in stool and melena.   Genitourinary: Negative for hematuria.   Musculoskeletal: Negative for myalgias.   Skin: Negative for rash.   Neurological: Negative for tremors, speech change and focal weakness.   Endo/Heme/Allergies: Does not bruise/bleed easily.   Psychiatric/Behavioral: Negative for hallucinations and memory loss.   All other systems reviewed and are negative.  Objective:   Vitals:    Vitals:    07/06/19 1016   Pulse: 84   Resp: 18   Temp: 36.1 C (97 F)   TempSrc: Thermal Scan   SpO2: 98%   Weight: 64.1 kg (141 lb 5 oz)   Height: 1.59 m (5' 2.6")   BMI: 25.41         Wt Readings from Last 3 Encounters:   07/06/19 64.1 kg (141 lb 5 oz)   02/28/19 64.6 kg (142 lb 6.7 oz)   01/27/19 63 kg (138 lb 14.2 oz)      Body mass index is 25.36 kg/m.  Physical Exam  Vitals and nursing note reviewed.   HENT:      Head: Normocephalic and atraumatic.   Eyes:      Pupils: Pupils are equal, round, and reactive to light.   Neck:      Thyroid: No thyromegaly.   Cardiovascular:      Rate and Rhythm: Normal rate and regular rhythm.      Heart sounds: No murmur heard.     Pulmonary:      Effort: Pulmonary effort is normal.      Breath sounds: Normal breath sounds.   Abdominal:      General: Bowel sounds are normal.      Palpations: Abdomen is soft.   Genitourinary:     General: Normal vulva.      Vagina: No vaginal discharge.      Rectum: Normal.   Musculoskeletal:         General: No deformity. Normal range of motion.      Cervical back: Neck supple.    Lymphadenopathy:      Cervical: No cervical adenopathy.   Skin:     General: Skin is warm and dry.   Neurological:      Mental Status: She is alert and oriented to person, place, and time.      Cranial Nerves: No cranial nerve deficit.   Psychiatric:         Mood and Affect: Affect normal.         Cognition and Memory: Memory normal.         Judgment: Judgment normal.         POCT Results:                   I have reviewed and interpreted  the point of care( POCT) results in this note and used this information in my assessment. Interperation: None performed Today  Virgel Gess, MD  07/06/2019, 11:12      Nursing Notes:   Quentin Mulling, Ambulatory Care Assistant  07/06/19 1030  [place], Ambulatory Care Assistant  07/06/2019, 10:23    Patient has been out of medication since 05/25/2019      Assessment & Plan:   1. Attention deficit disorder, unspecified hyperactivity presence  Chronic problem, Stable without significant change. Continue current treatment and call if any changes.      2. Left shoulder pain  From culture disc dis  - CT CERVICAL SPINE WO IV CONTRAST; Future  - CT CERVICAL SPINE WO IV CONTRAST    3. Lipid screening      4. Fatigue, unspecified type    - THYROID STIMULATING HORMONE WITH FREE T4 REFLEX; Future  - THYROID STIMULATING HORMONE WITH FREE T4 REFLEX    5. Vitamin D deficiency      6. Neck pain  Positional  Disc strain  Exercises 2-3 weeks and if not better get CT  - CT CERVICAL SPINE WO IV CONTRAST; Future  - CT CERVICAL SPINE WO IV CONTRAST                Health Maintenance   Topic Date Due   . HIV Screening  Never done   . Adult Tdap-Td (1 - Tdap) Never done   . Pap smear  Never done   . Shingles Vaccine (1 of 2) Never done   . Mammography  01/22/2021   . Colonoscopy  03/17/2028   . Influenza Vaccine  Completed     Return if symptoms worsen or fail to improve.  Status of Covid Vaccination  Pt had both dose of Moderna or Pfizer vaccine  [employer] Instructions and Precautions  explained  No follow-ups on file.  Virgel Gess, MD 07/06/2019 11:12

## 2019-07-13 ENCOUNTER — Encounter (INDEPENDENT_AMBULATORY_CARE_PROVIDER_SITE_OTHER): Payer: Self-pay | Admitting: FAMILY PRACTICE

## 2019-08-28 ENCOUNTER — Other Ambulatory Visit (INDEPENDENT_AMBULATORY_CARE_PROVIDER_SITE_OTHER): Payer: Self-pay | Admitting: FAMILY PRACTICE

## 2019-08-28 MED ORDER — METHYLPHENIDATE ER 18 MG TABLET,EXTENDED RELEASE 24 HR
18.00 mg | EXTENDED_RELEASE_TABLET | Freq: Every morning | ORAL | 0 refills | Status: DC
Start: 2019-08-28 — End: 2019-10-03

## 2019-08-28 NOTE — Telephone Encounter (Signed)
Regarding: rx refill  ----- Message from Berneta Levins sent at 08/28/2019  9:58 AM EDT -----  Doctor Name:  Virgel Gess, MD      Date of last appointment: 07/06/2019    Next scheduled visit: 08/29/2019    Medication Requested:     methylphenidate HCl 18 mg Oral Tablet Extended     Preferred Pharmacy      Hours: Not open 24 hours    [place] - [address]    [place] [address]    Phone: [phone] Fax: [phone]    Hours: Not open 24 hours

## 2019-08-28 NOTE — Telephone Encounter (Signed)
RX REFILL:    Last Labs:  Last Office Visit:07/06/19  Samuel Bouche  08/28/2019, 11:08

## 2019-08-29 ENCOUNTER — Encounter (INDEPENDENT_AMBULATORY_CARE_PROVIDER_SITE_OTHER): Payer: Self-pay | Admitting: FAMILY PRACTICE

## 2019-09-05 ENCOUNTER — Other Ambulatory Visit (HOSPITAL_BASED_OUTPATIENT_CLINIC_OR_DEPARTMENT_OTHER): Payer: BLUE CROSS/BLUE SHIELD

## 2019-10-03 ENCOUNTER — Other Ambulatory Visit (INDEPENDENT_AMBULATORY_CARE_PROVIDER_SITE_OTHER): Payer: Self-pay | Admitting: FAMILY PRACTICE

## 2019-10-03 NOTE — Telephone Encounter (Signed)
Patient requesting a refill on a controlled medication:    Has the patient completed a visit within the past 90 days? Yes  Is there an active medication agreement on file? Yes  Has a drug screen been completed in the last 12 months? No    Board of Pharmacy has been reviewed and a snip it has been entered in the note.     Last scheduled appointment with you was 07/06/2019.  Currently scheduled future appointment is 10/23/2019.    Claud Kelp, MA  10/03/2019, 13:27

## 2019-10-04 MED ORDER — METHYLPHENIDATE ER 18 MG TABLET,EXTENDED RELEASE 24 HR
18.00 mg | EXTENDED_RELEASE_TABLET | Freq: Every morning | ORAL | 0 refills | Status: DC
Start: 2019-10-04 — End: 2020-01-22

## 2019-10-16 ENCOUNTER — Other Ambulatory Visit (INDEPENDENT_AMBULATORY_CARE_PROVIDER_SITE_OTHER): Payer: Self-pay | Admitting: FAMILY PRACTICE

## 2019-10-23 ENCOUNTER — Encounter (INDEPENDENT_AMBULATORY_CARE_PROVIDER_SITE_OTHER): Payer: BLUE CROSS/BLUE SHIELD | Admitting: FAMILY PRACTICE

## 2019-11-17 ENCOUNTER — Other Ambulatory Visit: Payer: Self-pay

## 2019-11-17 ENCOUNTER — Encounter (FREE_STANDING_LABORATORY_FACILITY): Payer: BLUE CROSS/BLUE SHIELD | Admitting: Physician Assistant

## 2019-11-17 ENCOUNTER — Ambulatory Visit (INDEPENDENT_AMBULATORY_CARE_PROVIDER_SITE_OTHER): Payer: BLUE CROSS/BLUE SHIELD | Admitting: Physician Assistant

## 2019-11-17 ENCOUNTER — Ambulatory Visit: Payer: BLUE CROSS/BLUE SHIELD | Attending: Physician Assistant | Admitting: Physician Assistant

## 2019-11-17 ENCOUNTER — Encounter (INDEPENDENT_AMBULATORY_CARE_PROVIDER_SITE_OTHER): Payer: Self-pay | Admitting: Physician Assistant

## 2019-11-17 ENCOUNTER — Encounter (FREE_STANDING_LABORATORY_FACILITY)
Admit: 2019-11-17 | Discharge: 2019-11-17 | Disposition: A | Discharge status: Home / Self Care | Payer: BLUE CROSS/BLUE SHIELD | Attending: Physician Assistant | Admitting: Physician Assistant

## 2019-11-17 VITALS — BP 112/78 | HR 118 | Temp 97.5°F | Ht 62.0 in | Wt 138.0 lb

## 2019-11-17 DIAGNOSIS — Z20822 Contact with and (suspected) exposure to covid-19: Secondary | ICD-10-CM | POA: Insufficient documentation

## 2019-11-17 NOTE — Nursing Note (Signed)
pt was exposed to CVOID19 on Sunday 11/12/2019.     Nasal  specimen was collected in the correct container. The specimen was labeled and packaged for transport to [facility]. viral swab  Cristy Folks, RTR  11/17/2019, 14:19

## 2019-11-17 NOTE — Patient Instructions (Signed)
Coronavirus Disease 2019 (COVID-19): Caring for Yourself or Others   If you or a household member have symptoms of COVID-19, follow these guidelines for preventing spread of the virus and managing symptoms. This is regardless of your vaccination status.   If you have COVID-19 symptoms   Stay home. Call your healthcare provider and tell them you have symptoms of COVID-19. Do this before going to any hospital or clinic. Follow your provider's instructions. You may be advised to isolate yourself at home. This is called self-isolation. You may also be told to stay at least 6 feet from others to prevent the spread of COVID-19. This is called "social distancing."   Stay away from work, school, and public places. Limit physical contact with family members. Limit visitors. Don't kiss anyone or share eating or drinking utensils. Clean surfaces you touch with disinfectant. This is to help prevent the virus from spreading.   If you need to cough or sneeze, do it into a tissue. Then throw the tissue into the trash. If you don't have tissues, cough or sneeze into the bend of your elbow.   Wear a cloth face mask with two or more layers of washable, breathable fabric while in public or when indoors with people who don't live with you. Or you can wear a disposable paper mask with a cloth mask on top. You can make a cloth face mask of your own. The CDC has instructions on how to make a face mask. Wear the mask so that it covers both your nose and mouth.   Don't share food or personal items with people in your household. This includes items like eating and drinking utensils, towels, and bedding.   If you need to go to a hospital or clinic, expect that the healthcare staff will wear protective equipment such as masks, gowns, gloves, and eye protection. You may be advised to wait in or enter through a separate area. This is to prevent the possible virus from spreading.   Tell the healthcare staff about recent travel. This  includes local travel on public transport. Staff may need to find other people you have been in contact with.   Follow all instructions the healthcare staff give you.    If you have been diagnosed with COVID-19   Stay home and start self-isolation. Don't leave your home unless you need to get medical care. Don't go to work, school, or public areas. Don't use public transportation or taxis.   Follow all instructions from your healthcare provider. Call your healthcare provider's office before going. They can prepare and give you instructions. This will help prevent the virus from spreading.   If you need to go to a hospital or clinic, expect that the healthcare staff will wear protective equipment such as masks, gowns, gloves, and eye protection. You may be advised to wait in or enter through a separate area. This is to prevent the possible virus from spreading.   Wear a face mask with 2 or more layers. Use either a cloth mask with layers of tightly woven, breathable fabric or a disposable paper mask with a cloth mask on top. This is to protect other people from your germs. If you are not able to wear a mask, your caregivers should. You can make a cloth face mask of your own. The CDC has instructions on how to make a face mask. Wear the mask so that it covers both your nose and mouth.   Stay away from other people   in your home.   Avoid contact with pets and animals.   Don't share food or personal items with people in your household. This includes items like eating and drinking utensils, towels, and bedding.   If you need to cough or sneeze, do it into a tissue. Then throw the tissue into the trash. If you don't have tissues, cough or sneeze into the bend of your elbow.   Wash your hands often.    Self-care at home  The FDA has approved several vaccines to prevent COVID-19. One vaccine has been approved for people as young as 12. Talk with your healthcare provider about your risks and which vaccine is best  for you and your family.   Pregnant or breastfeeding people may choose to be vaccinated. Expert groups, including ACOG and the CDC, advise pregnant or breastfeeding people to talk with their healthcare provider about being vaccinated.   The vaccines are being rolled out to the public in phases. Check your local health department about your community's roll-out plans. The vaccines are given as a shot (injection) in the arm muscle. A 1-dose or 2-dose vaccine may be given. If you get the 2-dose vaccine, the second dose is given several weeks after the first.   Current treatment is mainly aimed at helping your body while it fights the virus. This is known as supportive care. For serious COVID-19, you may need to stay in the hospital. Supportive care includes:    Getting rest. This helps your body fight the illness.   Staying hydrated.  Drinking liquids is the best way to prevent dehydration. Try to drink 6 to 8 glasses of liquids every day, or as advised by your provider. Also check with your provider about which fluids are best for you. Don't drink fluids that contain caffeine or alcohol.   Taking over-the-counter (OTC) pain medicine. These are used to help ease pain and reduce fever. Follow your healthcare provider's instructions for which OTC medicine to use.  If you've been treated for suspected or confirmed COVID-19 , follow all of your healthcare team's instructions. This will include when it's OK to stop self-isolation. You may also get instructions on position changes to help your breathing, such as lying on your belly (prone positioning). If you were treated at a hospital and discharged, you may be sent home with a pulse oximeter. This is a small electronic device that you clip on your fingertip. It measures the amount of oxygen in your body. Follow your healthcare team's instructions on its use, how they will be in touch with you, and when to call them.   The FDA recently approved monoclonal antibody  therapy for emergency use in certain people who have a positive COVID-19 viral test and have mild to moderate symptoms but are not in the hospital. It's not widely available and is still being investigated. It's approved for people 12 years and older who weigh about 88 pounds (40 kgs) and are at high risk for severe COVID-19 and a hospital stay. This includes people who are 65 years and older and people with certain chronic conditions. Monoclonal antibody therapy is not approved for people who:    Are in the hospital with COVID-19, or   Need oxygen therapy for COVID-19, or   Need oxygen therapy for a chronic condition and need to have oxygen flow increased because of COVID-19  If you've had confirmed COVID-19, your healthcare team may ask you to consider donating your plasma. This is called COVID-19   convalescent plasma donation. Plasma from people fully recovered from COVID-19 may contain antibodies to help fight COVID-19 in people who are currently seriously ill with the disease. Experts don't know the safety of COVID-19 convalescent plasma or how well it works. Research continues. The FDA has approved it for emergency use in certain people with serious or life-threatening COVID-19.   Home care for a sick person   Follow all instructions from healthcare staff.   Wash your hands often.   Wear protective clothing as advised.   Make sure the sick person wears a mask. If they can't wear a mask, don't stay in the same room with the person. If you must be in the same room, wear a face mask. When wearing a mask, make sure that it covers both the nose and mouth.   Keep track of the sick person's symptoms.   Clean home surfaces often with disinfectant. This includes phones, kitchen counters, fridge door handle, bathroom surfaces, and others.   Don't let anyone share household items with the sick person. This includes eating and drinking tools, towels, sheets, or blankets.   Clean fabrics and laundry  thoroughly.   Keep other people and pets away from the sick person.    When you can stop self-isolation  When you are sick with COVID-19, you should stay away from other people. This is called self-isolation.   Your limits are different if you've had COVID-19 in the last 3 months but are fully recovered without symptoms and you have been exposed to someone with COVID-19. If you are symptom-free, you don't need to stay home away from others or be retested. The CDC doesn't recommend retesting unless you have symptoms of COVID-19 and your new symptoms can't be linked to another illness. Contact your healthcare provider if you have any questions. If you develop symptoms, stay home. If you had COVID-19 over 3 months ago and have been exposed again, treat it like you've never had COVID-19 and stay home, limit your contact with others, call your provider, and monitor for symptoms.   If you are normally healthy, the CDC does not advise retesting for COVID-19 with nose-throat swabs. You can stop self-isolation when all 3 of these are true:   1. You have had no fever for at least 24 hours. This means no fever without medicine that reduces fever, such as acetaminophen, for at least 24 hours.  2. Your symptoms such as cough or trouble breathing have improved.  3. It has been at least 10 days since your first symptoms started.  Talk with your healthcare provider before you leave home. Tell them if the 3 things above are true for you. They may tell you it's OK to leave home. In some cases, your state or local area may have specific advice. Your healthcare provider will tell you more.   If you have a weak immune system and COVID-19, or if you've had severe COVID-19,  your instructions on when to stop isolation will be somewhat different. Some conditions and treatments can cause a weak immune system. These include cancer treatment, bone marrow or organ transplants, and conditions such as HIV or other immune system disorders. You  may be advised to stay home from 10 days to 20 days after your symptoms first started. Your healthcare provider may want to retest you for COVID-19. Follow your provider's instructions.   Mask guidance  Consider the CDC's guidance and your local community's instructions on face masks.        Cloth masks may help prevent people who have COVID-19 from spreading the virus to others.   Cloth masks are most likely to reduce COVID-19 spread when masks are widely used by people who are out in the public.   Wear a mask inside your house if you live with someone who has symptoms of COVID-19 or has tested positive for COVID-19.   CDC's guidance for when to wear a mask and socially distance is different for fully vaccinated people. Fully vaccinated means 2 weeks after getting either the 1-dose or the second shot of the 2-dose vaccine. Fully vaccinated people don't need to wear a mask or socially distance in any setting (indoors or out) unless required by local, state, federal, or workplace rules. Follow your community's safety precautions.    Generally, the CDC advises people ages 2 and older who are not vaccinated to wear masks in public places and when around unvaccinated people who don't live in their household. But certain people should not wear a face mask. This includes:    Children younger than 2 years old   Anyone with a health, developmental, or mental health condition that can be made worse by wearing a mask   Anyone who is unconscious or unable to remove the face covering without help. See the CDC's guidance on who should not wear a face mask.    When to call your healthcare provider  Call your healthcare provider right away if a sick person has any of these:    Trouble breathing   Pain or pressure in chest  If a sick person has any of these, call 911:   Trouble breathing that gets worse   Pain or pressure in chest that gets worse   Blue tint to lips or face   Fast or irregular heartbeat   Confusion or  trouble waking   Fainting or loss of consciousness   Coughing up blood  Going home from the hospital   If you were diagnosed with COVID-19 and were recently discharged from the hospital:    Follow the instructions above for self-care and isolation.   Follow the hospital healthcare team's specific instructions.   Ask questions if anything is unclear to you. Write down answers so you remember them.  Date last modified: 06/02/2019  [employer] last reviewed this educational content on 04/20/2018   2000-2021 [employer]. All rights reserved. This information is not intended as a substitute for professional medical care. Always follow your healthcare professional's instructions.

## 2019-11-17 NOTE — Progress Notes (Signed)
[address], [facility]  [address]  [place] [address]  [facility]  Progress Note    Name: April Cordova MRN:  [id number]   Date: 11/17/2019 Age: 54 y.o.         Chief Complaint:    Chief Complaint   Patient presents with    COVID-19 Screening     BP 112/78    Pulse (!) 118    Temp 36.4 C (97.5 F)    Ht 1.575 m (5\' 2" )    Wt 62.6 kg (138 lb)    SpO2 97%    Breastfeeding No    BMI 25.24 kg/m         Subjective:  Patient is a 54yo female who is seen in the office for COVID 19 testing. Patient has exposure to patients having COVID 19.  She does have congestion. She denies symptoms of shortness of breath, difficulty breathing, cough, headache, fever, chills, nausea, vomiting, diarrhea, loss of sense of smell or taste.  She reports being in general good health.        The history is provided by the patient.       Review of Systems   Constitutional: Negative for activity change, appetite change, chills, diaphoresis, fatigue and fever.   HENT: Positive for congestion. Negative for postnasal drip, sinus pressure and sore throat.    Eyes: Negative for pain, discharge, redness and itching.   Respiratory: Negative for cough, chest tightness, shortness of breath and wheezing.    Cardiovascular: Negative for chest pain and palpitations.   Gastrointestinal: Negative for abdominal pain, diarrhea, nausea and vomiting.   Genitourinary: Negative.    Musculoskeletal: Negative for arthralgias, joint swelling, myalgias and neck pain.   Skin: Negative for color change and rash.   Allergic/Immunologic: Positive for environmental allergies.   Neurological: Negative for dizziness, weakness, light-headedness and headaches.   Psychiatric/Behavioral: Negative for sleep disturbance.   All other systems reviewed and are negative.      Past Medical History  No Known Allergies  Past Medical History:   Diagnosis Date    ADD (attention deficit disorder)     Anxiety     Cancer (CMS HCC)     basal cell ca   head  and shoulders    Depression      History reviewed. No pertinent surgical history.  Family Medical History:     Problem Relation (Age of Onset)    Alcohol abuse Brother    Alzheimer's/Dementia Mother    Breast Cancer Sister    Cancer Father    Drug Abuse Brother            Social History     Socioeconomic History    Marital status: Legally Separated     Spouse name: Not on file    Number of children: Not on file    Years of education: Not on file    Highest education level: Not on file   Tobacco Use    Smoking status: Former Smoker    Smokeless tobacco: Never Used   Substance and Sexual Activity    Alcohol use: Yes     Alcohol/week: 4.0 standard drinks     Types: 4 Glasses of wine per week    Sexual activity: Not Currently     Social Determinants of Health     Financial Resource Strain:     Difficulty of Paying Living Expenses:    Food Insecurity:     Worried About Running Out  of Food in the Last Year:     Arboriculturist in the Last Year:    Transportation Needs:     Film/video editor (Medical):     Lack of Transportation (Non-Medical):    Physical Activity:     Days of Exercise per Week:     Minutes of Exercise per Session:    Stress:     Feeling of Stress :    Intimate Partner Violence:     Fear of Current or Ex-Partner:     Emotionally Abused:     Physically Abused:     Sexually Abused:        Objective:    Physical Exam  Vitals and nursing note reviewed.   Constitutional:       General: She is not in acute distress.     Appearance: Normal appearance. She is not ill-appearing or toxic-appearing.   HENT:      Head: Normocephalic and atraumatic.      Nose: Nose normal. No congestion or rhinorrhea.   Eyes:      General: Lids are normal.      Extraocular Movements: Extraocular movements intact.      Conjunctiva/sclera: Conjunctivae normal.      Pupils: Pupils are equal, round, and reactive to light.   Cardiovascular:      Rate and Rhythm: Regular rhythm. Tachycardia present.   Pulmonary:       Effort: Pulmonary effort is normal. No respiratory distress.      Breath sounds: Normal breath sounds and air entry. No decreased breath sounds, wheezing, rhonchi or rales.   Musculoskeletal:         General: Normal range of motion.      Cervical back: Normal range of motion and neck supple.   Skin:     General: Skin is warm and dry.      Findings: No erythema or rash.   Neurological:      General: No focal deficit present.      Mental Status: She is alert and oriented to person, place, and time.   Psychiatric:         Attention and Perception: Attention and perception normal.         Mood and Affect: Mood and affect normal.         Speech: Speech normal.         Behavior: Behavior normal. Behavior is cooperative.         Thought Content: Thought content normal.         Judgment: Judgment normal.         Data Reviewed:    Current Outpatient Medications:     buPROPion (WELLBUTRIN XL) 300 mg extended release 24 hr tablet, TAKE 1 TABLET (300 MG TOTAL) BY MOUTH ONCE A DAY, Disp: 90 Tablet, Rfl: 1    methylphenidate HCl 18 mg Oral Tablet Extended Rel 24 hr, Take 1 Tablet (18 mg total) by mouth Every morning, Disp: 30 Tablet, Rfl: 0        Assessment/Plan:  Orders Placed This Encounter    COVID-19 [place] [facility]         ICD-10-CM    1. Close exposure to COVID-19 virus  Z20.822 COVID-19 [place] [facility]     COVID-19 [place] [facility]     COVID performed, will call with results.     PE is grossly normal.  OTC meds, push fluids as needed.  Go to the ER  if difficulty breathing occurs.  Self isolation until results are in.  Questions answered.  f/u prn.  Pt verbalizes understanding.       Return if symptoms worsen or fail to improve.    The supervising/collaborating physician for this visit was Dr. Claudette Head.  This visit was rendered independently without a supervising/collaborating physician on site.     Lynnea Maizes, PA-C  11/17/2019, 14:32

## 2019-11-18 LAB — COVID-19 ~~LOC~~ MOLECULAR LAB TESTING: SARS-CoV-2: NOT DETECTED

## 2019-12-21 ENCOUNTER — Other Ambulatory Visit (INDEPENDENT_AMBULATORY_CARE_PROVIDER_SITE_OTHER): Payer: Self-pay | Admitting: Family Medicine

## 2020-01-22 ENCOUNTER — Encounter (INDEPENDENT_AMBULATORY_CARE_PROVIDER_SITE_OTHER): Payer: Self-pay | Admitting: FAMILY PRACTICE

## 2020-01-22 ENCOUNTER — Ambulatory Visit (INDEPENDENT_AMBULATORY_CARE_PROVIDER_SITE_OTHER): Payer: BC Managed Care – PPO | Admitting: FAMILY PRACTICE

## 2020-01-22 ENCOUNTER — Telehealth (INDEPENDENT_AMBULATORY_CARE_PROVIDER_SITE_OTHER): Payer: Self-pay | Admitting: FAMILY PRACTICE

## 2020-01-22 ENCOUNTER — Other Ambulatory Visit: Payer: Self-pay

## 2020-01-22 VITALS — BP 132/84 | HR 76 | Temp 98.3°F | Wt 145.7 lb

## 2020-01-22 DIAGNOSIS — F32A Depression, unspecified: Secondary | ICD-10-CM

## 2020-01-22 DIAGNOSIS — F988 Other specified behavioral and emotional disorders with onset usually occurring in childhood and adolescence: Secondary | ICD-10-CM

## 2020-01-22 DIAGNOSIS — Z1231 Encounter for screening mammogram for malignant neoplasm of breast: Secondary | ICD-10-CM

## 2020-01-22 MED ORDER — METHYLPHENIDATE ER 18 MG TABLET,EXTENDED RELEASE 24 HR
18.00 mg | EXTENDED_RELEASE_TABLET | Freq: Every morning | ORAL | 0 refills | Status: DC
Start: 2020-01-22 — End: 2020-03-22

## 2020-01-22 MED ORDER — BUPROPION HCL XL 300 MG 24 HR TABLET, EXTENDED RELEASE
300.0000 mg | ORAL_TABLET | Freq: Every day | ORAL | 1 refills | Status: DC
Start: 2020-01-22 — End: 2020-04-26

## 2020-01-22 NOTE — Progress Notes (Signed)
OUTPATIENT PROGRESS NOTE    Subjective:   Patient ID:  April Cordova is a pleasant 55 y.o. female.    Chief Complaint: Medication Check      History of Present Illness:  1. Attention deficit disorder, unspecified hyperactivity presence  For ADHD and thinks she needs higher dose.current dose doesn't help as much      2. Depression, unspecified depression type  Under r4x               The history is provided by the patient.      Allergies:   No Known Allergies      Medications:   buPROPion (WELLBUTRIN XL) 300 mg extended release 24 hr tablet, TAKE 1 TABLET (300 MG TOTAL) BY MOUTH ONCE A DAY  methylphenidate HCl 18 mg Oral Tablet Extended Rel 24 hr, Take 1 Tablet (18 mg total) by mouth Every morning    No facility-administered medications prior to visit.        Immunization History:     Immunization History   Administered Date(s) Administered   . Covid-19 Vaccine,Pfizer-BioNTech,19yrs+ 05/31/2019, 06/21/2019         Past Medical History:     Past Medical History:   Diagnosis Date   . ADD (attention deficit disorder)    . Anxiety    . Cancer (CMS HCC)     basal cell ca   head and shoulders   . Depression              Past Surgical History:   Past Surgical History was reviewed and is negative for none          Family History:     Family Medical History:     Problem Relation (Age of Onset)    Alcohol abuse Brother    Alzheimer's/Dementia Mother    Breast Cancer Sister    Cancer Father    Drug Abuse Brother              Social History:   Fynleigh Noel  reports that she has quit smoking. She has never used smokeless tobacco. She reports current alcohol use of about 4 glasses of wine per week. She reports previously being sexually active.      Review of Systems: In addition to HPI  Review of Systems   Constitutional: Negative for chills, diaphoresis and fever.   HENT: Negative for ear pain and nosebleeds.    Eyes: Negative for double vision and discharge.   Respiratory: Negative for hemoptysis.    Cardiovascular: Negative for chest  pain and palpitations.   Gastrointestinal: Negative for blood in stool and melena.   Genitourinary: Negative for hematuria.   Musculoskeletal: Negative for myalgias.   Skin: Negative for rash.   Neurological: Negative for tremors, speech change and focal weakness.   Endo/Heme/Allergies: Does not bruise/bleed easily.   Psychiatric/Behavioral: Negative for hallucinations and memory loss.   All other systems reviewed and are negative.        Objective:   Vitals:    Vitals:    01/22/20 0928   BP: 132/84   Pulse: 76   Temp: 36.8 C (98.3 F)   TempSrc: Thermal Scan   SpO2: 98%   Weight: 66.1 kg (145 lb 11.6 oz)         Wt Readings from Last 3 Encounters:   01/22/20 66.1 kg (145 lb 11.6 oz)   11/17/19 62.6 kg (138 lb)   07/06/19 64.1 kg (141 lb 5 oz)  Body mass index is 26.65 kg/m.  Physical Exam  Vitals and nursing note reviewed.   HENT:      Head: Normocephalic and atraumatic.   Eyes:      Pupils: Pupils are equal, round, and reactive to light.   Neck:      Thyroid: No thyromegaly.   Cardiovascular:      Rate and Rhythm: Normal rate and regular rhythm.      Heart sounds: No murmur heard.     Pulmonary:      Effort: Pulmonary effort is normal.      Breath sounds: Normal breath sounds.   Abdominal:      General: Bowel sounds are normal.      Palpations: Abdomen is soft.   Genitourinary:     General: Normal vulva.      Vagina: No vaginal discharge.      Rectum: Normal.   Musculoskeletal:         General: No deformity. Normal range of motion.      Cervical back: Neck supple.   Lymphadenopathy:      Cervical: No cervical adenopathy.   Skin:     General: Skin is warm and dry.   Neurological:      Mental Status: She is alert and oriented to person, place, and time.      Cranial Nerves: No cranial nerve deficit.   Psychiatric:         Mood and Affect: Affect normal.         Cognition and Memory: Memory normal.         Judgment: Judgment normal.      Comments: Anxiety and poor attention at time         POCT Results:                    I have reviewed and interpreted  the point of care( POCT) results in this note and used this information in my assessment. Interperation: None performed Today  Virgel Gess, MD  01/22/2020, 09:53      There are no exam notes on file for this visit.    Assessment & Plan:     1. Attention deficit disorder, unspecified hyperactivity presence  Needs psyc eval. refil 1 month  - methylphenidate HCl 18 mg Oral Tablet Extended Rel 24 hr; Take 1 Tablet (18 mg total) by mouth Every morning  Dispense: 30 Tablet; Refill: 0  - Refer to [facility]; Future    2. Depression, unspecified depression type  stable  - buPROPion (WELLBUTRIN XL) 300 mg extended release 24 hr tablet; Take 1 Tablet (300 mg total) by mouth Once a day  Dispense: 90 Tablet; Refill: 1              Health Maintenance   Topic Date Due   . HIV Screening  Never done   . Adult Tdap-Td (1 - Tdap) Never done   . Shingles Vaccine (1 of 2) Never done   . Influenza Vaccine (1) 09/20/2019   . Covid-19 Vaccine (3 - Booster for Pfizer series) 12/21/2019   . Mammography  01/22/2021   . Pap smear  02/27/2022   . Colonoscopy  03/17/2028     Return if symptoms worsen or fail to improve.  Status of Covid Vaccination  Pt knows a booster is recommended 8 mos after the second initial vaccine and Pt has had Covid-19 illness  Covid-19 Instructions and Precautions explained  No follow-ups on file.  Herbie Baltimore  Kingsley Spittle, MD 01/22/2020 09:53

## 2020-02-22 ENCOUNTER — Encounter (HOSPITAL_COMMUNITY): Payer: Self-pay

## 2020-02-28 ENCOUNTER — Encounter

## 2020-03-07 ENCOUNTER — Other Ambulatory Visit: Payer: Self-pay

## 2020-03-07 ENCOUNTER — Encounter (HOSPITAL_BASED_OUTPATIENT_CLINIC_OR_DEPARTMENT_OTHER): Payer: Self-pay

## 2020-03-07 ENCOUNTER — Ambulatory Visit
Admission: RE | Admit: 2020-03-07 | Discharge: 2020-03-07 | Disposition: A | Discharge status: Home / Self Care | Payer: BC Managed Care – PPO | Source: Ambulatory Visit | Attending: FAMILY PRACTICE | Admitting: FAMILY PRACTICE

## 2020-03-07 DIAGNOSIS — Z1231 Encounter for screening mammogram for malignant neoplasm of breast: Secondary | ICD-10-CM

## 2020-03-22 ENCOUNTER — Other Ambulatory Visit: Payer: Self-pay

## 2020-03-22 ENCOUNTER — Encounter (HOSPITAL_COMMUNITY): Payer: Self-pay | Admitting: Student in an Organized Health Care Education/Training Program

## 2020-03-22 ENCOUNTER — Ambulatory Visit (INDEPENDENT_AMBULATORY_CARE_PROVIDER_SITE_OTHER): Payer: BC Managed Care – PPO | Admitting: Student in an Organized Health Care Education/Training Program

## 2020-03-22 DIAGNOSIS — F988 Other specified behavioral and emotional disorders with onset usually occurring in childhood and adolescence: Secondary | ICD-10-CM

## 2020-03-22 MED ORDER — METHYLPHENIDATE ER 36 MG TABLET,EXTENDED RELEASE 24 HR
36.00 mg | EXTENDED_RELEASE_TABLET | Freq: Every morning | ORAL | 0 refills | Status: DC
Start: 2020-03-22 — End: 2020-04-26

## 2020-03-22 NOTE — Progress Notes (Signed)
[facility] Medicine  [facility] & Psychiatry  Outpatient Intake History & Physical    IN PERSON VISIT     Patient name:  April Cordova   Chart number:  [id number]  Date of birth:  1965/12/26  Date of service:  03/22/2020      Identification:  April Cordova is a 55 y.o. female from [place] [address]      Chief Complaint:  "Establish Care"    Chief Complaint   Patient presents with    ADHD    Establish Care       History of Present Illness:    April Cordova is a 55 y.o. female who presents for outpatient psychiatric intake to establish for care.  Patient had been followed by her PCP for medication management and was referred, per chart review, for better control of her anxiety and ADHD symptoms.     Patient arrived 10 minutes late for appointment today.    Patient reports that she has a mild case of ADHD and moved from [place] about a year ago and there had been changes in her job that caused more work related stressors as well as a divorce.  Patient's PCP told her that he would not be able to Rx the higher dose of Concerta.  Pt had been waiting for months for a psychiatry appt and reached out to [facility] and they Rx Vyvanse.  Was very expensive for her, was able to get the first month of Vyvanse.  Taking 30 mg daily.        Regarding mood symptoms, patient feels her mood is well controlled by Wellbutrin for several years.  When depressed reports fatigue, low energy and motivation, hopelessness.  No previous SA, SI no previous hospital admissions.      In assessing for symptoms of mania, the patient denies decreased need for sleep, grandiosity, irritability and pressured speech.    Regarding anxiety symptoms, patient reports feeling her anxiety is well controlled.  Significant anxiety component from complicated divorce.  Describes her anxiety as poor sleep, restlessness, some physical symptoms.  Noticed that Vyvanse helped with anxiety a little.    Regarding psychotic  symptoms, denies AVH, delusions or paranoia.    Regarding substance use, patient reports quitting smoking several years ago (11's) while pregnant.  Endorses occasional wine, about 1 glass most evenings.  Denies THC, denies illicits.    Regarding traumatic experiences, patient denies, is having some significant difficulty with divorce.    Regarding focus, patient reports feeling forgetful, unfocused, disorganized, difficult following thought processes.      Past Psychiatric History:   Current and past outpatient psychiatric treatments:  None previous, brief counseling in [place].  Getting set up with a counselor (has degree) through the church   Inpatient psychiatric hospitalizations:  None  Medication trials:  Prozac (no benefit, didn't like it), Zoloft (very long time ago), Concerta (was switched to Vyvanse by doctor), Wellbutrin (current), Adderall (physical side effects)  Suicide attempts:  None    Past Medical History:  History of seizures: no.   History of closed head injury: no.     Past Medical History:   Diagnosis Date    ADD (attention deficit disorder)     Anxiety     Cancer (CMS HCC)     basal cell ca   head and shoulders    Depression        Past Surgical History:    None pertinent  Current Medications:    Current Outpatient Medications   Medication Sig    buPROPion (WELLBUTRIN XL) 300 mg extended release 24 hr tablet Take 1 Tablet (300 mg total) by mouth Once a day    methylphenidate HCl 36 mg Oral Tablet Extended Rel 24 hr Take 1 Tablet (36 mg total) by mouth Every morning        Allergies:    Allergies as of 03/22/2020    (No Known Allergies)       Social History:    Occupation:  Owns Barrister's clerk business in [employer] degree of education:  Masters in Dance movement psychotherapist  Marital status:  Going through a divorce currently  Children:  3 kids - 24, 85, and 54   Living situation:  Lives in [place], [place]  Substance use:   Tobacco: Quit  Alcohol: Occasional wine  Illicit Drugs:  Denies  Caffeine: 1 -2 cups coffee  Legal issues: None    Family History:  Suicides:  Unknown if brother's death was suicide, happened in 38.  Mental illness:  Mom poorly treated anxiety  Substance use: Brother died of drug overdose, sister ETOH use  Family Medical History:       Problem Relation (Age of Onset)    Alcohol abuse Brother    Alzheimer's/Dementia Mother    Breast Cancer Sister    Cancer Father    Drug Abuse Brother            Review of Systems:   General:  No fever, chills, or night sweats  Vision:  No vision changes  HENT:  No sinus congestion or sore throat  Cardiac:  No chest pain or palpitations  Respiratory:  No shortness of breath or cough  Abdomen:  No nausea, vomiting, or diarrhea   GU:  No dysuria or hematuria  Skin:  No rashes or bruises  Musculoskeletal:  No muscle or joint pain  Endocrine:  No diabetes or thyroid problems  Neuro:  No weakness or numbness  Psych:  Per HPI  All other ROS reviewed and negative.     Physical Examination:  Vitals:    03/22/20 1352   BP: 110/70   Pulse: 76   SpO2: 99%   Weight: 65.2 kg (143 lb 11.8 oz)   Height: 1.575 m (5\' 2" )   BMI: 26.35     General:  appears in good health, appears stated age, no distress and vital signs reviewed  Neurologic:  no gait abnormalities, motor or sensory deficits, or tremulousness    Mental Status Exam:  Level of Consciousness:  alert  Orientation:  grossly normal   Appearance: neatly dressed, dressed appropriately, well groomed and appears actual age.    Behavior:  cooperative and eye contact  Good   Motor:  no psychomotor retardation or agitation  Eye Contact good.   Speech:  normal   Memory:  grossly normal  Concentration:  good.   Mood:  "Pretty good"   Affect:  stable, appropriate and mood-congruent   Thought Process:  linear.   Thought Content:  appropriate No paranoia, delusions, or phobias stated  Perception:   no hallucinations endorsed  Suicidal Ideation:  none    Homicidal Ideation:  none  Insight:  good.   Judgement:   good.  Cognition:  good abstract ability.   Fund of Knowledge:  Appropriate for education level.      Labs:  Results in Last 18 Months   Lab Test 07/06/19  1124   TSH  1.551       Assessment: This is a 55 y.o. female presenting to [facility] outpatient clinic to establish care for management of ADHD, MDD, and GAD.  Patient had been managed with Wellbutrin and Concerta by her PCP in the past, as well as Prozac briefly. MDD and GAD appear well controlled, anxiety exacerbated by complicated divorce but handling well.  Patient attempted Vyvanse but expense of medication is prohibitive.  Agreeable to transitioning back to Concerta and increasing.  Patient has no safety concerns at this time, good support from children and church group.      ICD-10-CM    1. Attention deficit disorder, unspecified hyperactivity presence  F98.8 Refer to [facility]       Medication trials:  Prozac (no benefit, didn't like it), Zoloft (very long time ago), Concerta (was switched to Vyvanse by doctor), Wellbutrin (current), Adderall (physical side effects)    Plan:    Continue Wellbutrin XL 300 mg daily  Increase Concerta to 36 mg daily with consideration to increase further as tolerated  Labs:  None needed at this time  PDMP reviewed this visit, no data  Return in about 6 weeks (around 05/03/2020) for In Person Visit. Sooner if necessary.  Advised patient to call or use MyChart with any questions or concerns.   Patient was advised to call 911 and/or go to the nearest emergency department should her condition worsen or thoughts of suicide or homicide occur.      Osborne Oman, DO 03/22/2020   Resident Physician, PGY3      I saw and examined the patient in person at [facility] from 14:20 to 14:25 for a total of 5 minutes, along with the resident.  I was present and participated in the development of the treatment plan. I reviewed the resident's note. I agree with the findings and plan of care, as documented in the resident's note.  Any  exceptions/ additions are edited/noted.    Kendallyn Lippold N. Jeannetta Ellis, M.D.   Staff Psychiatrist  Harlie Buening Meribeth Mattes, MD  03/22/2020, 16:43

## 2020-04-25 ENCOUNTER — Encounter (INDEPENDENT_AMBULATORY_CARE_PROVIDER_SITE_OTHER): Payer: Self-pay | Admitting: FAMILY PRACTICE

## 2020-04-26 ENCOUNTER — Other Ambulatory Visit: Payer: Self-pay

## 2020-04-26 ENCOUNTER — Ambulatory Visit (INDEPENDENT_AMBULATORY_CARE_PROVIDER_SITE_OTHER): Payer: BC Managed Care – PPO | Admitting: Student in an Organized Health Care Education/Training Program

## 2020-04-26 ENCOUNTER — Ambulatory Visit (INDEPENDENT_AMBULATORY_CARE_PROVIDER_SITE_OTHER): Payer: BC Managed Care – PPO

## 2020-04-26 ENCOUNTER — Encounter (FREE_STANDING_LABORATORY_FACILITY): Payer: BC Managed Care – PPO | Admitting: Psychiatry

## 2020-04-26 ENCOUNTER — Encounter (FREE_STANDING_LABORATORY_FACILITY)
Admit: 2020-04-26 | Discharge: 2020-04-26 | Disposition: A | Discharge status: Home / Self Care | Payer: BC Managed Care – PPO | Attending: Psychiatry | Admitting: Psychiatry

## 2020-04-26 VITALS — BP 104/68 | HR 86 | Ht 62.0 in | Wt 146.2 lb

## 2020-04-26 DIAGNOSIS — F32A Depression, unspecified: Secondary | ICD-10-CM

## 2020-04-26 DIAGNOSIS — R5383 Other fatigue: Secondary | ICD-10-CM | POA: Insufficient documentation

## 2020-04-26 DIAGNOSIS — F988 Other specified behavioral and emotional disorders with onset usually occurring in childhood and adolescence: Secondary | ICD-10-CM

## 2020-04-26 LAB — BASIC METABOLIC PANEL
ANION GAP: 9 mmol/L (ref 4–13)
BUN/CREA RATIO: 16 (ref 6–22)
BUN: 11 mg/dL (ref 8–25)
CALCIUM: 8.9 mg/dL (ref 8.5–10.0)
CHLORIDE: 105 mmol/L (ref 96–111)
CO2 TOTAL: 25 mmol/L (ref 22–30)
CREATININE: 0.69 mg/dL (ref 0.60–1.05)
ESTIMATED GFR: >90 mL/min/BSA (ref >=60)
GLUCOSE: 100 mg/dL (ref 65–125)
POTASSIUM: 3.8 mmol/L (ref 3.5–5.1)
SODIUM: 139 mmol/L (ref 136–145)

## 2020-04-26 LAB — CBC WITH DIFF
BASOPHIL #: <0.1 10*3/uL (ref <=0.20)
BASOPHIL %: 1 %
EOSINOPHIL #: <0.1 10*3/uL (ref <=0.50)
EOSINOPHIL %: 1 %
HCT: 41.4 % (ref 34.8–46.0)
HGB: 14.4 g/dL (ref 11.5–16.0)
IMMATURE GRANULOCYTE #: <0.1 10*3/uL (ref <0.10)
IMMATURE GRANULOCYTE %: 0 % (ref 0–1)
LYMPHOCYTE #: 2.37 10*3/uL (ref 1.00–4.80)
LYMPHOCYTE %: 32 %
MCH: 32.2 pg — ABNORMAL HIGH (ref 26.0–32.0)
MCHC: 34.8 g/dL (ref 31.0–35.5)
MCV: 92.6 fL (ref 78.0–100.0)
MONOCYTE #: 0.75 10*3/uL (ref 0.20–1.10)
MONOCYTE %: 10 %
MPV: 10.1 fL (ref 8.7–12.5)
NEUTROPHIL #: 4.2 10*3/uL (ref 1.50–7.70)
NEUTROPHIL %: 56 %
PLATELETS: 281 10*3/uL (ref 150–400)
RBC: 4.47 10*6/uL (ref 3.85–5.22)
RDW-CV: 11.9 % (ref 11.5–15.5)
WBC: 7.5 10*3/uL (ref 3.7–11.0)

## 2020-04-26 MED ORDER — METHYLPHENIDATE ER 36 MG TABLET,EXTENDED RELEASE 24 HR
36.0000 mg | EXTENDED_RELEASE_TABLET | Freq: Every morning | ORAL | 0 refills | Status: DC
Start: 2020-05-24 — End: 2020-06-10

## 2020-04-26 MED ORDER — METHYLPHENIDATE ER 36 MG TABLET,EXTENDED RELEASE 24 HR
36.0000 mg | EXTENDED_RELEASE_TABLET | Freq: Every morning | ORAL | 0 refills | Status: DC
Start: 2020-06-21 — End: 2020-06-22

## 2020-04-26 MED ORDER — BUPROPION HCL XL 300 MG 24 HR TABLET, EXTENDED RELEASE
300.0000 mg | ORAL_TABLET | Freq: Every day | ORAL | 1 refills | Status: DC
Start: 2020-04-26 — End: 2020-07-17

## 2020-04-26 MED ORDER — METHYLPHENIDATE ER 36 MG TABLET,EXTENDED RELEASE 24 HR
36.0000 mg | EXTENDED_RELEASE_TABLET | Freq: Every morning | ORAL | 0 refills | Status: DC
Start: 2020-04-26 — End: 2020-06-10

## 2020-04-26 NOTE — Nursing Note (Signed)
YD Obtained green and lavender top tube with butterfly.

## 2020-04-26 NOTE — Progress Notes (Signed)
[facility] Medicine  [facility] Medicine & Psychiatry  Outpatient Follow Up    IN PERSON VISIT     Patient name:  April Cordova   Chart number:  [id number]  Date of birth:  1965/05/19  Date of service:  04/26/2020      Identification:  April Cordova is a 55 y.o. female from [place] [address]      Chief Complaint:  "Inattention"    Chief Complaint   Patient presents with    Medication follow up       Subjective:    April Cordova is a 55 y.o. female who presents for outpatient psychiatric intake for regular follow up.  Last seen March 2022.    Since last visit the patient reports that the medication has been helpful for her.  Feels like the increase in dose has been somewhat more helpful and denies side effects. Sleep is good, feels like she generally has good energy in the day, has noticed more muscle aches and fatigue but has been working out more.  She also wonders if she is going through menopause which could be related.  Otherwise patient is doing well, no other physical complaints, no safety concerns. Has good support from family.        Past Medical History:   Diagnosis Date    ADD (attention deficit disorder)     Anxiety     Cancer (CMS HCC)     basal cell ca   head and shoulders    Depression            Current Medications:    Current Outpatient Medications   Medication Sig    buPROPion (WELLBUTRIN XL) 300 mg extended release 24 hr tablet Take 1 Tablet (300 mg total) by mouth Once a day    methylphenidate HCl 36 mg Oral Tablet Extended Rel 24 hr Take 1 Tablet (36 mg total) by mouth Every morning           Review of Systems:   General:  No fever, chills, or night sweats  Vision:  No vision changes  HENT:  No sinus congestion or sore throat  Cardiac:  No chest pain or palpitations  Respiratory:  No shortness of breath or cough  Abdomen:  No nausea, vomiting, or diarrhea   GU:  No dysuria or hematuria  Skin:  No rashes or bruises  Musculoskeletal:  No muscle or joint pain  Endocrine:  No  diabetes or thyroid problems  Neuro:  No weakness or numbness  Psych:  Per HPI  All other ROS reviewed and negative.     Physical Examination:  Vitals:    04/26/20 1554   BP: 104/68   Pulse: 86   SpO2: 98%   Weight: 66.3 kg (146 lb 2.6 oz)   Height: 1.575 m (5\' 2" )   BMI: 26.79     General:  appears in good health, appears stated age, no distress and vital signs reviewed  Neurologic:  no gait abnormalities, motor or sensory deficits, or tremulousness    Mental Status Exam:  Level of Consciousness:  alert  Orientation:  grossly normal   Appearance: neatly dressed, dressed appropriately, well groomed and appears actual age.    Behavior:  cooperative and eye contact  Good   Motor:  no psychomotor retardation or agitation  Eye Contact good.   Speech:  normal   Memory:  grossly normal  Concentration:  good.   Mood:  "Pretty good"  Affect:  stable, appropriate and mood-congruent   Thought Process:  linear.   Thought Content:  appropriate No paranoia, delusions, or phobias stated  Perception:   no hallucinations endorsed  Suicidal Ideation:  none    Homicidal Ideation:  none  Insight:  good.   Judgement:  good.  Cognition:  good abstract ability.   Fund of Knowledge:  Appropriate for education level.      Labs:  Results in Last 18 Months   Lab Test 07/06/19  1124   TSH 1.551       Assessment: This is a 55 y.o. female presenting to [facility] outpatient clinic to establish care for management of ADHD, MDD, and GAD.  Patient had been managed with Wellbutrin and Concerta by her PCP in the past, as well as Prozac briefly. MDD and GAD appear well controlled, anxiety exacerbated by complicated divorce but handling well.  Patient attempted Vyvanse but expense of medication is prohibitive.  Agreeable to transitioning back to Concerta and increasing if necessary.  Patient has no safety concerns at this time, good support from children and church group.     2 ICD-10-CM    1. Attention deficit disorder, unspecified hyperactivity presence   F98.8        Medication trials:  Prozac (no benefit, didn't like it), Zoloft (very long time ago), Concerta (was switched to Vyvanse by doctor), Wellbutrin (current), Adderall (physical side effects)    Plan:    Continue Wellbutrin XL 300 mg daily  Continue Concerta to 36 mg daily with consideration to increase further as tolerated  Labs:  CBC, BMP ordered for fatigue and muscle aches  PDMP reviewed this visit, no data  Return in about 3 months (around 07/26/2020) for In Person Visit. Sooner if necessary.  Advised patient to call or use MyChart with any questions or concerns.   Patient was advised to call 911 and/or go to the nearest emergency department should her condition worsen or thoughts of suicide or homicide occur.      Osborne Oman, DO 04/26/2020   Resident Physician, PGY3      I saw and examined the patient.  I reviewed the resident's note.  I agree with the findings and plan of care as documented in the resident's note.  Any exceptions/additions are edited/noted.    Carroll Kinds, MD

## 2020-04-29 ENCOUNTER — Encounter (INDEPENDENT_AMBULATORY_CARE_PROVIDER_SITE_OTHER): Payer: Self-pay | Admitting: FAMILY PRACTICE

## 2020-04-29 ENCOUNTER — Encounter (HOSPITAL_COMMUNITY): Payer: Self-pay | Admitting: Student in an Organized Health Care Education/Training Program

## 2020-04-29 ENCOUNTER — Encounter (INDEPENDENT_AMBULATORY_CARE_PROVIDER_SITE_OTHER): Payer: BC Managed Care – PPO | Admitting: FAMILY PRACTICE

## 2020-06-07 ENCOUNTER — Other Ambulatory Visit (HOSPITAL_COMMUNITY): Payer: Self-pay | Admitting: Student in an Organized Health Care Education/Training Program

## 2020-06-10 ENCOUNTER — Ambulatory Visit (INDEPENDENT_AMBULATORY_CARE_PROVIDER_SITE_OTHER): Payer: BC Managed Care – PPO | Admitting: Medical

## 2020-06-10 ENCOUNTER — Encounter (INDEPENDENT_AMBULATORY_CARE_PROVIDER_SITE_OTHER): Payer: Self-pay | Admitting: Medical

## 2020-06-10 ENCOUNTER — Other Ambulatory Visit: Payer: Self-pay

## 2020-06-10 VITALS — BP 118/70 | HR 80 | Temp 97.1°F | Ht 62.0 in | Wt 141.0 lb

## 2020-06-10 DIAGNOSIS — J329 Chronic sinusitis, unspecified: Secondary | ICD-10-CM

## 2020-06-10 MED ORDER — AMOXICILLIN 875 MG-POTASSIUM CLAVULANATE 125 MG TABLET
1.0000 | ORAL_TABLET | Freq: Two times a day (BID) | ORAL | 0 refills | Status: DC
Start: 2020-06-10 — End: 2021-01-31

## 2020-06-10 NOTE — Nursing Note (Signed)
Pt presents today with nasal drainage, sinus headache and chest congestion and cough for the past 2 weeks. Pt is treating with Claritin.

## 2020-06-10 NOTE — Progress Notes (Signed)
Chief Complaint:    Chief Complaint   Patient presents with   . Nasal Drainage   . Chest Congestion   . Cough       HPI:  Allergies in fall, typically not is spring. Digging in garden.  Do use Flonase intermittently, feel like contributes. Pressure worsening, can't clear it, clear drainage.    Nursing Notes:   Carrington Clamp, RTR  06/10/20 1313  Signed  Pt presents today with nasal drainage, sinus headache and chest congestion and cough for the past 2 weeks. Pt is treating with Claritin.         Review of Systems:  Review of Systems   Constitutional: Negative for chills and fever.   HENT: Positive for congestion, ear pain, rhinorrhea, sinus pressure and sinus pain.    Respiratory: Positive for cough.    Musculoskeletal: Positive for arthralgias (baseline) and myalgias.   Skin: Negative for rash.   Neurological: Positive for headaches.               Physical Exam:   BP 118/70   Pulse 80   Temp 36.2 C (97.1 F)   Ht 1.575 m (5\' 2" )   Wt 64 kg (141 lb)   SpO2 99%   BMI 25.79 kg/m         Physical Exam  Constitutional:       General: She is not in acute distress.  HENT:      Right Ear: Ear canal normal.      Left Ear: Ear canal normal.      Ears:      Comments: Bilateral serous fluid, mid position TMs     Mouth/Throat:      Pharynx: No oropharyngeal exudate or posterior oropharyngeal erythema.   Eyes:      Extraocular Movements: Extraocular movements intact.      Conjunctiva/sclera: Conjunctivae normal.   Cardiovascular:      Rate and Rhythm: Normal rate and regular rhythm.      Pulses: Normal pulses.   Pulmonary:      Effort: Pulmonary effort is normal.      Breath sounds: Normal breath sounds.   Musculoskeletal:      Cervical back: No tenderness.   Lymphadenopathy:      Cervical: No cervical adenopathy.   Skin:     General: Skin is warm and dry.   Neurological:      Mental Status: She is oriented to person, place, and time.                           Assessment/Plan:    1. Sinusitis, unspecified chronicity,  unspecified location  - on oral antihistamine and decongest, duration and worsening facial symptoms suggestive of bacterial process. Also recommend adding nasal steroid for allergic rhinitis  - amoxicillin-pot clavulanate (AUGMENTIN) 875-125 mg Oral Tablet; Take 1 Tablet by mouth Every 12 hours  Dispense: 20 Tablet; Refill: 0

## 2020-06-22 ENCOUNTER — Other Ambulatory Visit (HOSPITAL_COMMUNITY): Payer: Self-pay | Admitting: Student in an Organized Health Care Education/Training Program

## 2020-06-24 MED ORDER — METHYLPHENIDATE ER 36 MG TABLET,EXTENDED RELEASE 24 HR
36.0000 mg | EXTENDED_RELEASE_TABLET | Freq: Every morning | ORAL | 0 refills | Status: DC
Start: 2020-06-24 — End: 2020-07-17

## 2020-07-17 ENCOUNTER — Ambulatory Visit (INDEPENDENT_AMBULATORY_CARE_PROVIDER_SITE_OTHER): Payer: BC Managed Care – PPO | Admitting: Student in an Organized Health Care Education/Training Program

## 2020-07-17 ENCOUNTER — Other Ambulatory Visit: Payer: Self-pay

## 2020-07-17 DIAGNOSIS — Z6826 Body mass index (BMI) 26.0-26.9, adult: Secondary | ICD-10-CM

## 2020-07-17 DIAGNOSIS — F32A Depression, unspecified: Secondary | ICD-10-CM

## 2020-07-17 MED ORDER — METHYLPHENIDATE ER 36 MG TABLET,EXTENDED RELEASE 24 HR
36.0000 mg | EXTENDED_RELEASE_TABLET | Freq: Every morning | ORAL | 0 refills | Status: DC
Start: 2020-09-12 — End: 2020-10-16

## 2020-07-17 MED ORDER — METHYLPHENIDATE ER 36 MG TABLET,EXTENDED RELEASE 24 HR
36.0000 mg | EXTENDED_RELEASE_TABLET | Freq: Every morning | ORAL | 0 refills | Status: DC
Start: 2020-07-17 — End: 2020-08-11

## 2020-07-17 MED ORDER — BUPROPION HCL XL 300 MG 24 HR TABLET, EXTENDED RELEASE
300.0000 mg | ORAL_TABLET | Freq: Every day | ORAL | 1 refills | Status: DC
Start: 2020-07-17 — End: 2020-09-09

## 2020-07-17 MED ORDER — METHYLPHENIDATE ER 36 MG TABLET,EXTENDED RELEASE 24 HR
36.0000 mg | EXTENDED_RELEASE_TABLET | Freq: Every morning | ORAL | 0 refills | Status: DC
Start: 2020-08-14 — End: 2020-10-16

## 2020-07-17 NOTE — Progress Notes (Signed)
[facility] Medicine  [facility] Medicine & Psychiatry  Outpatient Follow Up    IN PERSON VISIT     Patient name:  April Cordova   Chart number:  [id number]  Date of birth:  1965-10-17  Date of service:  07/17/2020      Identification:  April Cordova is a 55 y.o. female from [place] [address]      Chief Complaint:  "Inattention"    Chief Complaint   Patient presents with    Medication follow up       Subjective:    April Cordova is a 55 y.o. female who presents for outpatient psychiatric intake for regular follow up.  Last seen April 2022.    Since last visit the patient reports doing well.  Is reading a book about breathing and she finds it helpful to learn about this.  Finds the medication helpful.  Getting good sleep.  Feels she has an excellent support system.  Finalized her divorce a couple of weeks ago and this has been stressful but is ending up well.  No safety concerns at this time.        Past Medical History:   Diagnosis Date    ADD (attention deficit disorder)     Anxiety     Cancer (CMS HCC)     basal cell ca   head and shoulders    Depression            Current Medications:    Current Outpatient Medications   Medication Sig    amoxicillin-pot clavulanate (AUGMENTIN) 875-125 mg Oral Tablet Take 1 Tablet by mouth Every 12 hours    buPROPion (WELLBUTRIN XL) 300 mg extended release 24 hr tablet Take 1 Tablet (300 mg total) by mouth Once a day    methylphenidate HCl 36 mg Oral Tablet Extended Rel 24 hr Take 1 Tablet (36 mg total) by mouth Every morning           Review of Systems:   General:  No fever, chills, or night sweats  Vision:  No vision changes  HENT:  No sinus congestion or sore throat  Cardiac:  No chest pain or palpitations  Respiratory:  No shortness of breath or cough  Abdomen:  No nausea, vomiting, or diarrhea   GU:  No dysuria or hematuria  Skin:  No rashes or bruises  Musculoskeletal:  No muscle or joint pain  Endocrine:  No diabetes or thyroid problems  Neuro:  No  weakness or numbness  Psych:  Per HPI  All other ROS reviewed and negative.     Physical Examination:  Vitals:    07/17/20 1629   BP: 113/62   Pulse: 82   SpO2: 99%   Weight: 66.6 kg (146 lb 13.2 oz)   Height: 1.575 m (5\' 2" )   BMI: 26.91     General:  appears in good health, appears stated age, no distress and vital signs reviewed  Neurologic:  no gait abnormalities, motor or sensory deficits, or tremulousness    Mental Status Exam:  Level of Consciousness:  alert  Orientation:  grossly normal   Appearance: neatly dressed, dressed appropriately, well groomed and appears actual age.    Behavior:  cooperative and eye contact  Good   Motor:  no psychomotor retardation or agitation  Eye Contact good.   Speech:  normal   Memory:  grossly normal  Concentration:  good.   Mood:  "Really good"   Affect:  stable, appropriate and mood-congruent   Thought Process:  linear.   Thought Content:  appropriate No paranoia, delusions, or phobias stated  Perception:   no hallucinations endorsed  Suicidal Ideation:  none    Homicidal Ideation:  none  Insight:  good.   Judgement:  good.  Cognition:  good abstract ability.   Fund of Knowledge:  Appropriate for education level.      Labs:  Results in Last 18 Months   Lab Test 07/06/19  1124   TSH 1.551       Assessment: This is a 56 y.o. female presenting to [facility] outpatient clinic to establish care for management of ADHD, MDD, and GAD.  Patient had been managed with Wellbutrin and Concerta by her PCP in the past, as well as Prozac briefly. MDD and GAD appear well controlled, anxiety exacerbated by complicated divorce but handling well.  Patient attempted Vyvanse but expense of medication is prohibitive.  Agreeable to transitioning back to Concerta and increasing if necessary.  Patient has no safety concerns at this time, good support from children and church group.      ICD-10-CM    1. Depression, unspecified depression type  F32.A        Medication trials:  Prozac (no benefit, didn't like  it), Zoloft (very long time ago), Concerta (was switched to Vyvanse by doctor), Wellbutrin (current), Adderall (physical side effects)    Plan:    Continue Wellbutrin XL 300 mg daily  Continue Concerta 36 mg daily with consideration to increase further as tolerated  Labs:  CBC, BMP ordered for fatigue and muscle aches  PDMP reviewed this visit, no data  Return in about 3 months (around 10/17/2020) for In Person Visit. Sooner if necessary.  Advised patient to call or use MyChart with any questions or concerns.   Patient was advised to call 911 and/or go to the nearest emergency department should her condition worsen or thoughts of suicide or homicide occur.      Osborne Oman, DO 07/17/2020   Resident Physician, PGY3    I was immediately available in clinic for consultation regarding this patient. I reviewed the resident's note.  I agree with the findings and plan of care as documented in the resident's note.  Any exceptions/additions are edited/noted.    Sheila Oats, MD 07/17/2020

## 2020-08-11 ENCOUNTER — Other Ambulatory Visit (HOSPITAL_COMMUNITY): Payer: Self-pay | Admitting: Student in an Organized Health Care Education/Training Program

## 2020-08-12 MED ORDER — METHYLPHENIDATE ER 36 MG TABLET,EXTENDED RELEASE 24 HR
36.0000 mg | EXTENDED_RELEASE_TABLET | Freq: Every morning | ORAL | 0 refills | Status: DC
Start: 2020-08-12 — End: 2020-09-13

## 2020-09-09 ENCOUNTER — Other Ambulatory Visit (HOSPITAL_COMMUNITY): Payer: Self-pay | Admitting: Student in an Organized Health Care Education/Training Program

## 2020-09-09 DIAGNOSIS — F32A Depression, unspecified: Secondary | ICD-10-CM

## 2020-09-09 MED ORDER — BUPROPION HCL XL 300 MG 24 HR TABLET, EXTENDED RELEASE
300.0000 mg | ORAL_TABLET | Freq: Every day | ORAL | 1 refills | Status: DC
Start: 2020-09-09 — End: 2020-12-04

## 2020-09-13 ENCOUNTER — Other Ambulatory Visit (HOSPITAL_COMMUNITY): Payer: Self-pay | Admitting: Student in an Organized Health Care Education/Training Program

## 2020-09-13 MED ORDER — METHYLPHENIDATE ER 36 MG TABLET,EXTENDED RELEASE 24 HR
36.0000 mg | EXTENDED_RELEASE_TABLET | Freq: Every morning | ORAL | 0 refills | Status: DC
Start: 2020-09-13 — End: 2020-10-31

## 2020-10-16 ENCOUNTER — Ambulatory Visit (INDEPENDENT_AMBULATORY_CARE_PROVIDER_SITE_OTHER): Payer: 59 | Admitting: Student in an Organized Health Care Education/Training Program

## 2020-10-16 ENCOUNTER — Other Ambulatory Visit: Payer: Self-pay

## 2020-10-16 DIAGNOSIS — F32A Depression, unspecified: Secondary | ICD-10-CM

## 2020-10-16 DIAGNOSIS — F988 Other specified behavioral and emotional disorders with onset usually occurring in childhood and adolescence: Secondary | ICD-10-CM

## 2020-10-16 MED ORDER — METHYLPHENIDATE ER 36 MG TABLET,EXTENDED RELEASE 24 HR
36.0000 mg | EXTENDED_RELEASE_TABLET | Freq: Every morning | ORAL | 0 refills | Status: DC
Start: 2020-11-11 — End: 2021-01-21

## 2020-10-16 MED ORDER — METHYLPHENIDATE ER 36 MG TABLET,EXTENDED RELEASE 24 HR
36.0000 mg | EXTENDED_RELEASE_TABLET | Freq: Every morning | ORAL | 0 refills | Status: DC
Start: 2020-12-10 — End: 2021-01-21

## 2020-10-16 NOTE — Progress Notes (Signed)
[name]  Outpatient Follow Up    IN PERSON VISIT     Patient name:  April Cordova   Chart number:  [id number]  Date of birth:  September 12, 1965  Date of service:  10/16/2020      Identification:  April Cordova is a 55 y.o. female from [place] [address]      Chief Complaint:  "Inattention"    Chief Complaint   Patient presents with   . Medication follow up       Subjective:    April Cordova is a 55 y.o. female who presents for outpatient psychiatric intake for regular follow up.  Last seen June 2022.    Since last visit the patient reports doing well.  Notes some very minor anxiety in the morning that is not very disturbing to her.  Notices some teeth clenching as well.  Considering attempting lavender supplements vs topical oils.  Patient otherwise has no safety concerns and has very good support from family and friends.          Past Medical History:   Diagnosis Date   . ADD (attention deficit disorder)    . Anxiety    . Cancer (CMS HCC)     basal cell ca   head and shoulders   . Depression            Current Medications:    Current Outpatient Medications   Medication Sig   . amoxicillin-pot clavulanate (AUGMENTIN) 875-125 mg Oral Tablet Take 1 Tablet by mouth Every 12 hours   . buPROPion (WELLBUTRIN XL) 300 mg extended release 24 hr tablet Take 1 Tablet (300 mg total) by mouth Once a day   . methylphenidate HCl 36 mg Oral Tablet Extended Rel 24 hr Take 1 Tablet (36 mg total) by mouth Every morning   . [START ON 12/10/2020] methylphenidate HCl 36 mg Oral Tablet Extended Rel 24 hr Take 1 Tablet (36 mg total) by mouth Every morning   . [START ON 11/11/2020] methylphenidate HCl 36 mg Oral Tablet Extended Rel 24 hr Take 1 Tablet (36 mg total) by mouth Every morning           Review of Systems:    General:  No fever, chills, or night sweats   Vision:  No vision changes   HENT:  No sinus congestion or sore throat   Cardiac:  No chest pain or  palpitations   Respiratory:  No shortness of breath or cough   Abdomen:  No nausea, vomiting, or diarrhea    GU:  No dysuria or hematuria   Skin:  No rashes or bruises   Musculoskeletal:  No muscle or joint pain   Endocrine:  No diabetes or thyroid problems   Neuro:  No weakness or numbness   Psych:  Per HPI   All other ROS reviewed and negative.     Physical Examination:  There were no vitals filed for this visit.   General:  appears in good health, appears stated age, no distress and vital signs reviewed   Neurologic:  no gait abnormalities, motor or sensory deficits, or tremulousness    Mental Status Exam:   Level of Consciousness:  alert   Orientation:  grossly normal    Appearance: neatly dressed, dressed appropriately, well groomed and appears actual age.     Behavior:  cooperative and eye contact  Good    Motor:  no psychomotor retardation or  agitation   Eye Contact good.    Speech:  normal    Memory:  grossly normal   Concentration:  good.    Mood:  "Really good"    Affect:  stable, appropriate and mood-congruent    Thought Process:  linear.    Thought Content:  appropriate No paranoia, delusions, or phobias stated   Perception:   no hallucinations endorsed   Suicidal Ideation:  none     Homicidal Ideation:  none   Insight:  good.    Judgement:  good.   Cognition:  good abstract ability.    Fund of Knowledge:  Appropriate for education level.      Labs:  Results in Last 18 Months   Lab Test 07/06/19  1124   TSH 1.551       Assessment: This is a 55 y.o. female presenting to [facility] outpatient clinic to establish care for management of ADHD, MDD, and GAD.  Patient had been managed with Wellbutrin and Concerta by her PCP in the past, as well as Prozac briefly. MDD and GAD appear well controlled, anxiety exacerbated by complicated divorce but handling well.  Patient attempted Vyvanse but expense of medication is prohibitive.  Agreeable to transitioning back to Concerta and increasing  if necessary.  Patient has no safety concerns at this time, good support from children and church group.      ICD-10-CM    1. Attention deficit disorder, unspecified hyperactivity presence  F98.8    2. Depression, unspecified depression type  F32.A        Medication trials:  Prozac (no benefit, didn't like it), Zoloft (very long time ago), Concerta (was switched to Vyvanse by doctor), Wellbutrin (current), Adderall (physical side effects)    Plan:    1. Continue Wellbutrin XL 300 mg daily  2. Continue Concerta 36 mg daily with consideration to increase further as tolerated  3. Labs:  CBC, BMP ordered for fatigue and muscle aches  4. PDMP reviewed this visit, no data  5. Return in about 3 months (around 01/15/2021) for In Person Visit. Sooner if necessary.  6. Advised patient to call or use MyChart with any questions or concerns.   7. Patient was advised to call 911 and/or go to the nearest emergency department should her condition worsen or thoughts of suicide or homicide occur.      April Oman, DO 10/16/2020   Resident Physician, PGY4    I was immediately available in clinic for consultation regarding this patient. I reviewed the resident's note.  I agree with the findings and plan of care as documented in the resident's note.  Any exceptions/additions are edited/noted.    April Oats, MD 10/16/2020

## 2020-10-31 ENCOUNTER — Other Ambulatory Visit (HOSPITAL_COMMUNITY): Payer: Self-pay | Admitting: Student in an Organized Health Care Education/Training Program

## 2020-11-01 MED ORDER — METHYLPHENIDATE ER 36 MG TABLET,EXTENDED RELEASE 24 HR
36.0000 mg | EXTENDED_RELEASE_TABLET | Freq: Every morning | ORAL | 0 refills | Status: DC
Start: 2020-11-01 — End: 2020-12-04

## 2020-11-21 ENCOUNTER — Encounter (HOSPITAL_COMMUNITY): Payer: Self-pay

## 2020-11-25 ENCOUNTER — Other Ambulatory Visit (HOSPITAL_COMMUNITY): Payer: Self-pay | Admitting: Student in an Organized Health Care Education/Training Program

## 2020-11-25 DIAGNOSIS — F32A Depression, unspecified: Secondary | ICD-10-CM

## 2020-12-04 ENCOUNTER — Other Ambulatory Visit (HOSPITAL_COMMUNITY): Payer: Self-pay | Admitting: Student in an Organized Health Care Education/Training Program

## 2020-12-04 DIAGNOSIS — F32A Depression, unspecified: Secondary | ICD-10-CM

## 2020-12-04 MED ORDER — METHYLPHENIDATE ER 36 MG TABLET,EXTENDED RELEASE 24 HR
36.0000 mg | EXTENDED_RELEASE_TABLET | Freq: Every morning | ORAL | 0 refills | Status: DC
Start: 2020-12-04 — End: 2021-01-21

## 2020-12-04 MED ORDER — BUPROPION HCL XL 300 MG 24 HR TABLET, EXTENDED RELEASE
300.0000 mg | ORAL_TABLET | Freq: Every day | ORAL | 1 refills | Status: DC
Start: 2020-12-04 — End: 2021-04-04

## 2021-01-21 ENCOUNTER — Ambulatory Visit (HOSPITAL_COMMUNITY): Payer: PPO | Admitting: Student in an Organized Health Care Education/Training Program

## 2021-01-21 ENCOUNTER — Other Ambulatory Visit: Payer: Self-pay

## 2021-01-21 DIAGNOSIS — F32A Depression, unspecified: Secondary | ICD-10-CM

## 2021-01-21 DIAGNOSIS — F988 Other specified behavioral and emotional disorders with onset usually occurring in childhood and adolescence: Secondary | ICD-10-CM

## 2021-01-21 MED ORDER — METHYLPHENIDATE ER 36 MG TABLET,EXTENDED RELEASE 24 HR
36.0000 mg | EXTENDED_RELEASE_TABLET | Freq: Every morning | ORAL | 0 refills | Status: DC
Start: 2021-01-21 — End: 2021-02-17

## 2021-01-21 NOTE — Progress Notes (Signed)
[facility] Medicine  [facility] Medicine & Psychiatry  Outpatient Follow Up    IN PERSON VISIT     Patient name:  April Cordova   Chart number:  [id number]  Date of birth:  09/30/1965  Date of service:  01/21/2021      Identification:  April Cordova is a 56 y.o. female from [place] [address]      Chief Complaint:  "Inattention"    Chief Complaint   Patient presents with    Medication follow up       Subjective:    April Cordova is a 56 y.o. female who presents for outpatient psychiatric intake for regular follow up.  Last seen September 2022.    Since last visit the patient reports doing well. Continues to take the Concerta, not daily.  Notes a little bit of edginess if she takes it multiple days in a row.  Takes it about 4x weekly and finds significant benefit from this.  Patient otherwise has had no changes to mood or anxiety.  Sleep is good.  Recently moved and is trying to get organized but no other stressors.  Has good support from family and boyfriend.      Past Medical History:   Diagnosis Date    ADD (attention deficit disorder)     Anxiety     Cancer (CMS HCC)     basal cell ca   head and shoulders    Depression            Current Medications:    Current Outpatient Medications   Medication Sig    amoxicillin-pot clavulanate (AUGMENTIN) 875-125 mg Oral Tablet Take 1 Tablet by mouth Every 12 hours    buPROPion (WELLBUTRIN XL) 300 mg extended release 24 hr tablet Take 1 Tablet (300 mg total) by mouth Once a day    methylphenidate HCl 36 mg Oral Tablet Extended Rel 24 hr Take 1 Tablet (36 mg total) by mouth Every morning    methylphenidate HCl 36 mg Oral Tablet Extended Rel 24 hr Take 1 Tablet (36 mg total) by mouth Every morning    methylphenidate HCl 36 mg Oral Tablet Extended Rel 24 hr Take 1 Tablet (36 mg total) by mouth Every morning           Review of Systems:   General:  No fever, chills, or night sweats  Vision:  No vision changes  HENT:  No sinus congestion or sore  throat  Cardiac:  No chest pain or palpitations  Respiratory:  No shortness of breath or cough  Abdomen:  No nausea, vomiting, or diarrhea   GU:  No dysuria or hematuria  Skin:  No rashes or bruises  Musculoskeletal:  No muscle or joint pain  Endocrine:  No diabetes or thyroid problems  Neuro:  No weakness or numbness  Psych:  Per HPI  All other ROS reviewed and negative.     Physical Examination:  There were no vitals filed for this visit.  General:  appears in good health, appears stated age, no distress and vital signs reviewed  Neurologic:  no gait abnormalities, motor or sensory deficits, or tremulousness    Mental Status Exam:  Level of Consciousness:  alert  Orientation:  grossly normal   Appearance: neatly dressed, dressed appropriately, well groomed and appears actual age.    Behavior:  cooperative and eye contact  Good   Motor:  no psychomotor retardation or agitation  Eye Contact good.  Speech:  normal   Memory:  grossly normal  Concentration:  good.   Mood:  "Really good"   Affect:  stable, appropriate and mood-congruent   Thought Process:  linear.   Thought Content:  appropriate No paranoia, delusions, or phobias stated  Perception:   no hallucinations endorsed  Suicidal Ideation:  none    Homicidal Ideation:  none  Insight:  good.   Judgement:  good.  Cognition:  good abstract ability.   Fund of Knowledge:  Appropriate for education level.      Labs:  No results for input(s): TSH, AST, ALT, HA1C, GLUCOSEFAST, CHOLESTEROL, HDLCHOL, LDLCHOL, LDLCHOLDIR, TRIG in the last 13140 hours.    Assessment: This is a 56 y.o. female presenting to [facility] outpatient clinic to establish care for management of ADHD, MDD, and GAD.  Patient had been managed with Wellbutrin and Concerta by her PCP in the past, as well as Prozac briefly. MDD and GAD appear well controlled, anxiety exacerbated by complicated divorce but handling well.  Patient attempted Vyvanse but expense of medication is prohibitive.  Agreeable to  transitioning back to Concerta and increasing if necessary.  Patient has no safety concerns at this time, good support from children and church group.      ICD-10-CM    1. Attention deficit disorder, unspecified hyperactivity presence  F98.8       2. Depression, unspecified depression type  F32.A           Medication trials:  Prozac (no benefit, didn't like it), Zoloft (very long time ago), Concerta (was switched to Vyvanse by doctor), Wellbutrin (current), Adderall (physical side effects)    Plan:    Continue Wellbutrin XL 300 mg daily  Continue Concerta 36 mg daily with consideration to increase further as tolerated  Labs:  CBC, BMP ordered for fatigue and muscle aches, reviewed  PDMP reviewed this visit, no data  Return in about 3 months (around 04/21/2021) for In Person Visit. Sooner if necessary.  Advised patient to call or use MyChart with any questions or concerns.   Patient was advised to call 911 and/or go to the nearest emergency department should her condition worsen or thoughts of suicide or homicide occur.      Osborne Oman, DO 01/21/2021   Resident Physician, PGY4      This was an in-person encounter. Case was discussed & reviewed with the resident physician at the time of the encounter on 01/21/2021. I did not directly see the patient but was present in the clinic and participated in the development of the treatment plan. I reviewed the resident's note. I agree with the findings and plan of care as documented in the resident's note. Any exceptions or additions are edited/noted.     Franchot Mimes, MD  01/21/2021, 17:36

## 2021-01-27 ENCOUNTER — Ambulatory Visit (INDEPENDENT_AMBULATORY_CARE_PROVIDER_SITE_OTHER): Payer: Self-pay | Admitting: FAMILY PRACTICE

## 2021-01-27 NOTE — Nursing Note (Signed)
The patient's boyfriend called and said that the patient has been checked in at the [facility]  ED for over an hour with abdominal pain which he described as severe.  He asked if I could call over and have them see her now.  I told him that we have no influence on that.  He asked if Dr. Kingsley Spittle could see her now and I told him that Dr. Kingsley Spittle was done for the morning and would not be back in the office for a few hours.  He was very persistent and asked if he should go to a different hospital? I advised that was his decision.  He asked what I would do if it was my loved one,  I advised he should keep asking at the front desk if he felt that it was an emergency.  He again asked if he should go to another hospital even though it was an hour away.  I said that it would be up to him and If I felt like I was not getting the help that I needed I would go to a different hospital. He said OK and ended the call.

## 2021-01-31 ENCOUNTER — Encounter (INDEPENDENT_AMBULATORY_CARE_PROVIDER_SITE_OTHER): Payer: Self-pay | Admitting: NURSE PRACTITIONER

## 2021-01-31 ENCOUNTER — Ambulatory Visit (INDEPENDENT_AMBULATORY_CARE_PROVIDER_SITE_OTHER): Payer: PPO | Admitting: NURSE PRACTITIONER

## 2021-01-31 ENCOUNTER — Other Ambulatory Visit: Payer: Self-pay

## 2021-01-31 VITALS — BP 132/70 | HR 95 | Temp 97.4°F | Ht 62.0 in | Wt 150.8 lb

## 2021-01-31 DIAGNOSIS — Z0000 Encounter for general adult medical examination without abnormal findings: Secondary | ICD-10-CM

## 2021-01-31 DIAGNOSIS — Z23 Encounter for immunization: Secondary | ICD-10-CM

## 2021-01-31 DIAGNOSIS — Z1239 Encounter for other screening for malignant neoplasm of breast: Secondary | ICD-10-CM

## 2021-01-31 NOTE — Progress Notes (Unsigned)
[facility], [facility]  [address]  [place] [address]  [facility]     Name: April Cordova MRN:  [id number]   Date: 01/31/2021 Age: 56 y.o.       OUTPATIENT PROGRESS NOTE     Subjective:   Patient ID:  Ms. April Cordova is a pleasant 56 y.o. female.    Chief Complaint: Annual Exam, Vaccination (Shingles vaccine ), and Abdominal Pain (URQ pain on Monday- left AMA)    History of Present Illness:  Annual exam: April Cordova is a 56 year old who presents for an annual examination. Concerns today include: one recent episode ov RUQ pain which last several days then resolved, negative fever, chills or diarrhea. Pertinent negatives include: chest pain, shortness of breath, abdominal pain, or changes in bowel habits. Regular exercise: yes, Seat belt use: yes, LMP: monthly.    The history is provided by the patient.       Allergies:   No Known Allergies    Medications:   buPROPion (WELLBUTRIN XL) 300 mg extended release 24 hr tablet, Take 1 Tablet (300 mg total) by mouth Once a day  methylphenidate HCl 36 mg Oral Tablet Extended Rel 24 hr, Take 1 Tablet (36 mg total) by mouth Every morning  amoxicillin-pot clavulanate (AUGMENTIN) 875-125 mg Oral Tablet, Take 1 Tablet by mouth Every 12 hours    No facility-administered medications prior to visit.      Immunization History:     Immunization History   Administered Date(s) Administered   . Covid-19 Vaccine,Pfizer-BioNTech,Purple Top,79yrs+ 05/31/2019, 06/21/2019       Past Medical History:     Past Medical History:   Diagnosis Date   . ADD (attention deficit disorder)    . Anxiety    . Cancer (CMS HCC)     basal cell ca   head and shoulders   . Depression        Past Surgical History:   negative surgical hx    Family History:     Family Medical History:     Problem Relation (Age of Onset)    Alcohol abuse Brother    Alzheimer's/Dementia Mother    Breast Cancer Sister    Cancer Father    Drug Abuse Brother            Social History:     Social History      Socioeconomic History   . Marital status: Legally Separated   Tobacco Use   . Smoking status: Former   . Smokeless tobacco: Never   Vaping Use   . Vaping Use: Never used   Substance and Sexual Activity   . Alcohol use: Yes     Alcohol/week: 4.0 standard drinks     Types: 4 Glasses of wine per week   . Sexual activity: Not Currently       Review of Systems: In addition to HPI  Review of Systems   Constitutional: Negative for malaise/fatigue.   Respiratory: Negative for cough and shortness of breath.    Cardiovascular: Negative for chest pain and palpitations.   Gastrointestinal: Negative for abdominal pain.   Musculoskeletal: Negative for back pain.   Skin: Negative for rash.   Neurological: Negative for headaches.   Psychiatric/Behavioral: The patient is not nervous/anxious.        Objective:   Vitals:   Vitals:    01/31/21 1346   BP: 132/70   Pulse: 95   Temp: 36.3 C (97.4 F)   TempSrc:  Thermal Scan   SpO2: 97%   Weight: 68.4 kg (150 lb 12.7 oz)   Height: 1.575 m (5\' 2" )   BMI: 27.64          Body mass index is 27.58 kg/m.    Physical Exam  Vitals and nursing note reviewed.   Constitutional:       Appearance: Normal appearance. She is well-developed.   HENT:      Head: Normocephalic.      Right Ear: Tympanic membrane normal.      Left Ear: Tympanic membrane normal.      Mouth/Throat:      Mouth: Mucous membranes are moist.   Eyes:      Pupils: Pupils are equal, round, and reactive to light.   Cardiovascular:      Rate and Rhythm: Normal rate and regular rhythm.      Heart sounds: Normal heart sounds.   Pulmonary:      Effort: Pulmonary effort is normal.      Breath sounds: Normal breath sounds.   Chest:   Breasts:     Breasts are symmetrical.      Right: Normal.      Left: Normal.   Abdominal:      Palpations: Abdomen is soft.      Tenderness: There is no abdominal tenderness.   Musculoskeletal:      Cervical back: Neck supple.   Lymphadenopathy:      Cervical: No cervical adenopathy.   Skin:     General:  Skin is warm and dry.   Neurological:      Mental Status: She is alert and oriented to person, place, and time.   Psychiatric:         Mood and Affect: Mood normal.         Behavior: Behavior normal.         Assessment & Plan:   1. Annual physical exam  -well exam today, pap up to date, due for mammogram    2. Encounter for screening for malignant neoplasm of breast, unspecified screening modality  -orders generated she will schedule  - MAMMO BILATERAL SCREENING-ADDL VIEWS/BREAST US AS REQ BY RAD; Future  - MAMMO BILATERAL SCREENING-ADDL VIEWS/BREAST US AS REQ BY RAD    3. Need for vaccination  vaccine given today- printed information provided, any questions answered today  -she will return in one month for the flu vaccine  - Shingrix Zoster Vaccine (Admin)    POCT Results:                   No results found for: Arnold Line, FLUAPOC, FLUBPOC, [place]      I have reviewed and confirmed the above point of care results.  Theodis Aguas, APRN 01/31/2021, 14:32  There are no exam notes on file for this visit.    The patient has been educated and verbalized understanding regarding the services provided during this visit.    Health Maintenance   Topic Date Due   . Adult Tdap-Td (1 - Tdap) Never done   . Shingles Vaccine (1 of 2) Never done   . Influenza Vaccine (1) 09/19/2020   . Mammography  03/07/2021   . Pap smear  02/27/2022   . Colonoscopy  03/17/2028   . Meningococcal Vaccine  Aged Out   . Pneumococcal Vaccine, Age 4-64  Aged Out   . Depression Screening  Discontinued   . HIV Screening  Discontinued   . Covid-19 Vaccine  Discontinued  Return if symptoms worsen or fail to improve.  Theodis Aguas, APRN 01/31/2021 14:32

## 2021-02-02 ENCOUNTER — Encounter (INDEPENDENT_AMBULATORY_CARE_PROVIDER_SITE_OTHER): Payer: Self-pay | Admitting: NURSE PRACTITIONER

## 2021-02-17 ENCOUNTER — Other Ambulatory Visit (HOSPITAL_COMMUNITY): Payer: Self-pay | Admitting: Student in an Organized Health Care Education/Training Program

## 2021-02-17 MED ORDER — METHYLPHENIDATE ER 36 MG TABLET,EXTENDED RELEASE 24 HR
36.0000 mg | EXTENDED_RELEASE_TABLET | Freq: Every morning | ORAL | 0 refills | Status: DC
Start: 2021-02-17 — End: 2021-04-29

## 2021-02-25 ENCOUNTER — Other Ambulatory Visit (HOSPITAL_COMMUNITY): Payer: Self-pay | Admitting: Student in an Organized Health Care Education/Training Program

## 2021-02-25 DIAGNOSIS — F32A Depression, unspecified: Secondary | ICD-10-CM

## 2021-03-07 ENCOUNTER — Ambulatory Visit (INDEPENDENT_AMBULATORY_CARE_PROVIDER_SITE_OTHER): Payer: PPO

## 2021-03-07 ENCOUNTER — Other Ambulatory Visit: Payer: Self-pay

## 2021-03-07 DIAGNOSIS — Z23 Encounter for immunization: Secondary | ICD-10-CM

## 2021-03-07 NOTE — Nursing Note (Signed)
1. Are you 54 years of age or older? yes  2. Have you ever had a severe reaction to a flu shot? no  3. Are you allergic to eggs? no  4. Are you allergic to latex? no  5. Are you allergic to Thimerosol? no  6. Are you experiencing acute illness symptoms or have you been running a fever? no  7. Do you have a medical condition or taking medications that suppress your immune system? no  8. Have you ever had Guillain-Barre syndrome or other neurologic disorder? no  9. Are you pregnant or breastfeeding? no    Immunization administered     Name Date Dose VIS Date Route    Influenza Vaccine, 6 month-adult 03/07/2021 0.5 mL 08/25/2019 Intramuscular    Site: Right deltoid    Given By: Claud Kelp, MA    Manufacturer: GlaxoSmithKline    Lot: [id number]    NDC: [id number]          [place], [place] 03/07/2021, 16:16

## 2021-03-07 NOTE — Patient Instructions (Signed)
Vaccine Information Statement    Influenza (Flu) Vaccine (Inactivated or Recombinant): What You Need to Know    Many vaccine information statements are available in Spanish and other languages. See www.immunize.org/vis.  Hojas de informacin sobre vacunas estn disponibles en espaol y en muchos otros idiomas. Visite www.immunize.org/vis.    1. Why get vaccinated?    Influenza vaccine can prevent influenza (flu).    Flu is a contagious disease that spreads around the [place] every year, usually between October and May. Anyone can get the flu, but it is more dangerous for some people. Infants and young children, people 65 years and older, pregnant people, and people with certain health conditions or a weakened immune system are at greatest risk of flu complications.    Pneumonia, bronchitis, sinus infections, and ear infections are examples of flu-related complications. If you have a medical condition, such as heart disease, cancer, or diabetes, flu can make it worse.    Flu can cause fever and chills, sore throat, muscle aches, fatigue, cough, headache, and runny or stuffy nose. Some people may have vomiting and diarrhea, though this is more common in children than adults.     In an average year, thousands of people in the [place] die from flu, and many more are hospitalized. Flu vaccine prevents millions of illnesses and flu-related visits to the doctor each year.    2. Influenza vaccines     CDC recommends everyone 6 months and older get vaccinated every flu season. Children 6 months through 8 years of age may need 2 doses during a single flu season. Everyone else needs only 1 dose each flu season.    It takes about 2 weeks for protection to develop after vaccination.    There are many flu viruses, and they are always changing. Each year a new flu vaccine is made to protect against the influenza viruses believed to be likely to cause disease in the upcoming flu season. Even when the vaccine doesn't  exactly match these viruses, it may still provide some protection.     Influenza vaccine does not cause flu.    Influenza vaccine may be given at the same time as other vaccines.    3. Talk with your health care provider    Tell your vaccination provider if the person getting the vaccine:  . Has had an allergic reaction after a previous dose of influenza vaccine, or has any severe, life-threatening allergies   . Has ever had Guillain-Barr Syndrome (also called "GBS")    In some cases, your health care provider may decide to postpone influenza vaccination until a future visit.    Influenza vaccine can be administered at any time during pregnancy. People who are or will be pregnant during influenza season should receive inactivated influenza vaccine.    People with minor illnesses, such as a cold, may be vaccinated. People who are moderately or severely ill should usually wait until they recover before getting influenza vaccine.    Your health care provider can give you more information.    4. Risks of a vaccine reaction    . Soreness, redness, and swelling where the shot is given, fever, muscle aches, and headache can happen after influenza vaccination.  . There may be a very small increased risk of Guillain-Barr Syndrome (GBS) after inactivated influenza vaccine (the flu shot).    Young children who get the flu shot along with pneumococcal vaccine (PCV13) and/or DTaP vaccine at the same time might be   slightly more likely to have a seizure caused by fever. Tell your health care provider if a child who is getting flu vaccine has ever had a seizure.    People sometimes faint after medical procedures, including vaccination. Tell your provider if you feel dizzy or have vision changes or ringing in the ears.    As with any medicine, there is a very remote chance of a vaccine causing a severe allergic reaction, other serious injury, or death.    5. What if there is a serious problem?    An allergic reaction could occur  after the vaccinated person leaves the clinic. If you see signs of a severe allergic reaction (hives, swelling of the face and throat, difficulty breathing, a fast heartbeat, dizziness, or weakness), call 9-1-1 and get the person to the nearest hospital.    For other signs that concern you, call your health care provider.    Adverse reactions should be reported to the Vaccine Adverse Event Reporting System (VAERS). Your health care provider will usually file this report, or you can do it yourself. Visit the VAERS website at www.vaers.hhs.gov or call 1-800-822-7967. VAERS is only for reporting reactions, and VAERS staff members do not give medical advice.    6. The National Vaccine Injury Compensation Program    The National Vaccine Injury Compensation Program (VICP) is a federal program that was created to compensate people who may have been injured by certain vaccines. Claims regarding alleged injury or death due to vaccination have a time limit for filing, which may be as short as two years. Visit the VICP website at www.hrsa.gov/vaccinecompensation or call 1-800-338-2382 to learn about the program and about filing a claim.     7. How can I learn more?    . Ask your health care provider.   . Call your local or state health department.   . Visit the website of the Food and Drug Administration (FDA) for vaccine package inserts and additional information at www.fda.gov/vaccines-blood-biologics/vaccines.  . Contact the Centers for Disease Control and Prevention (CDC):  - Call 1-800-232-4636 (1-800-CDC-INFO) or  - Visit CDC's influenza website at www.cdc.gov/flu.    Vaccine Information Statement   Inactivated Influenza Vaccine   08/25/2019  42 U.S.C.  300aa-26   Department of Health and Human Services  Centers for Disease Control and Prevention    Office Use Only

## 2021-03-10 ENCOUNTER — Encounter (HOSPITAL_BASED_OUTPATIENT_CLINIC_OR_DEPARTMENT_OTHER): Payer: Self-pay

## 2021-03-10 ENCOUNTER — Other Ambulatory Visit: Payer: Self-pay

## 2021-03-10 ENCOUNTER — Inpatient Hospital Stay
Admission: RE | Admit: 2021-03-10 | Discharge: 2021-03-10 | Disposition: A | Discharge status: Home / Self Care | Payer: PPO | Source: Ambulatory Visit | Attending: NURSE PRACTITIONER | Admitting: NURSE PRACTITIONER

## 2021-03-10 DIAGNOSIS — Z1239 Encounter for other screening for malignant neoplasm of breast: Secondary | ICD-10-CM

## 2021-03-10 DIAGNOSIS — N6489 Other specified disorders of breast: Secondary | ICD-10-CM | POA: Insufficient documentation

## 2021-03-11 DIAGNOSIS — Z1231 Encounter for screening mammogram for malignant neoplasm of breast: Secondary | ICD-10-CM

## 2021-03-11 DIAGNOSIS — N6489 Other specified disorders of breast: Secondary | ICD-10-CM

## 2021-03-12 ENCOUNTER — Other Ambulatory Visit (INDEPENDENT_AMBULATORY_CARE_PROVIDER_SITE_OTHER): Payer: Self-pay | Admitting: NURSE PRACTITIONER

## 2021-03-12 DIAGNOSIS — R928 Other abnormal and inconclusive findings on diagnostic imaging of breast: Secondary | ICD-10-CM

## 2021-03-17 ENCOUNTER — Inpatient Hospital Stay
Admission: RE | Admit: 2021-03-17 | Discharge: 2021-03-17 | Disposition: A | Discharge status: Home / Self Care | Payer: PPO | Source: Ambulatory Visit | Attending: NURSE PRACTITIONER | Admitting: NURSE PRACTITIONER

## 2021-03-17 ENCOUNTER — Other Ambulatory Visit (INDEPENDENT_AMBULATORY_CARE_PROVIDER_SITE_OTHER): Payer: Self-pay | Admitting: NURSE PRACTITIONER

## 2021-03-17 ENCOUNTER — Other Ambulatory Visit: Payer: Self-pay

## 2021-03-17 ENCOUNTER — Inpatient Hospital Stay (HOSPITAL_BASED_OUTPATIENT_CLINIC_OR_DEPARTMENT_OTHER)
Admission: RE | Admit: 2021-03-17 | Discharge: 2021-03-17 | Disposition: A | Discharge status: Home / Self Care | Payer: PPO | Source: Ambulatory Visit

## 2021-03-17 DIAGNOSIS — R928 Other abnormal and inconclusive findings on diagnostic imaging of breast: Secondary | ICD-10-CM | POA: Insufficient documentation

## 2021-03-17 DIAGNOSIS — N6489 Other specified disorders of breast: Secondary | ICD-10-CM

## 2021-04-01 ENCOUNTER — Other Ambulatory Visit: Payer: Self-pay

## 2021-04-01 ENCOUNTER — Ambulatory Visit (INDEPENDENT_AMBULATORY_CARE_PROVIDER_SITE_OTHER): Payer: PPO

## 2021-04-01 DIAGNOSIS — Z23 Encounter for immunization: Secondary | ICD-10-CM

## 2021-04-01 MED ORDER — SHINGRIX (PF) 50 MCG/0.5 ML INTRAMUSCULAR SUSPENSION, KIT
0.5000 mL | INHALATION_SUSPENSION | Freq: Once | INTRAMUSCULAR | 0 refills | Status: AC
Start: 2021-04-01 — End: 2021-04-01

## 2021-04-01 NOTE — Nursing Note (Signed)
[facility] [employer]     Patient received vaccine in clinic.  Tolerated it well, given VIS sheet and was discharged to home.  Immunization administered     Name Date Dose VIS Date Route    Shingrix - Zoster Vaccine 04/01/2021 0.5 mL 02/23/2020 Intramuscular    Site: Right arm    Given By: Meryle Ready, Ambulatory Care Assistant    Manufacturer: GlaxoSmithKline    Lot: [id number]    NDC: [id number]          Meryle Ready, Ambulatory Care Assistant  04/01/2021, 09:01

## 2021-04-04 ENCOUNTER — Other Ambulatory Visit (HOSPITAL_COMMUNITY): Payer: Self-pay | Admitting: Student in an Organized Health Care Education/Training Program

## 2021-04-04 DIAGNOSIS — F32A Depression, unspecified: Secondary | ICD-10-CM

## 2021-04-06 MED ORDER — BUPROPION HCL XL 300 MG 24 HR TABLET, EXTENDED RELEASE
300.0000 mg | ORAL_TABLET | Freq: Every day | ORAL | 1 refills | Status: DC
Start: 2021-04-06 — End: 2021-07-09

## 2021-04-22 ENCOUNTER — Encounter (HOSPITAL_COMMUNITY): Payer: PPO | Admitting: Student in an Organized Health Care Education/Training Program

## 2021-04-29 ENCOUNTER — Other Ambulatory Visit: Payer: Self-pay

## 2021-04-29 ENCOUNTER — Ambulatory Visit (HOSPITAL_COMMUNITY): Payer: PPO | Admitting: Student in an Organized Health Care Education/Training Program

## 2021-04-29 VITALS — BP 117/72 | HR 75 | Ht 62.0 in | Wt 147.3 lb

## 2021-04-29 DIAGNOSIS — F32A Depression, unspecified: Secondary | ICD-10-CM

## 2021-04-29 DIAGNOSIS — F988 Other specified behavioral and emotional disorders with onset usually occurring in childhood and adolescence: Secondary | ICD-10-CM

## 2021-04-29 MED ORDER — METHYLPHENIDATE ER 36 MG TABLET,EXTENDED RELEASE 24 HR
36.0000 mg | EXTENDED_RELEASE_TABLET | Freq: Every morning | ORAL | 0 refills | Status: DC
Start: 2021-04-29 — End: 2021-07-09

## 2021-04-29 MED ORDER — BUPROPION HCL XL 150 MG 24 HR TABLET, EXTENDED RELEASE
150.0000 mg | ORAL_TABLET | Freq: Every day | ORAL | 0 refills | Status: DC
Start: 2021-04-29 — End: 2021-06-06

## 2021-04-29 NOTE — Progress Notes (Signed)
[facility] Medicine  [facility] Medicine & Psychiatry  Outpatient Follow Up    IN PERSON VISIT     Patient name:  Mollye Guinta   Chart number:  [id number]  Date of birth:  12/08/1965  Date of service:  04/29/2021      Identification:  Ivy Puryear is a 56 y.o. female from [place] [address]      Chief Complaint:  "Inattention"    Chief Complaint   Patient presents with    Medication follow up       Subjective:    Cristella Stiver is a 56 y.o. female who presents for outpatient regular follow up.    Since last visit the patient reports doing well.  Is recently engaged and planning a wedding in June.  Inquired whether Wellbutrin and Concerta can cause neck tension and muscle tightness as well as jaw clenching.  Patient doesn't notice significantly increased anxiety and reports heating pads and Botox injections have been helpful.  Mood has improved with the warmer weather and resolution of several stressors and she is willing to decrease her Wellbutrin to see if this is beneficial for her.  Patient has excellent support from family.  No safety concerns.      Past Medical History:   Diagnosis Date    ADD (attention deficit disorder)     Anxiety     Cancer (CMS HCC)     basal cell ca   head and shoulders    Depression        Current Medications:  Current Outpatient Medications   Medication Sig    buPROPion (WELLBUTRIN XL) 300 mg extended release 24 hr tablet Take 1 Tablet (300 mg total) by mouth Once a day    methylphenidate HCl 36 mg Oral Tablet Extended Rel 24 hr Take 1 Tablet (36 mg total) by mouth Every morning       Review of Systems:   General:  No fever, chills, or night sweats  Vision:  No vision changes  HENT:  No sinus congestion or sore throat  Cardiac:  No chest pain or palpitations  Respiratory:  No shortness of breath or cough  Abdomen:  No nausea, vomiting, or diarrhea   GU:  No dysuria or hematuria  Skin:  No rashes or bruises  Musculoskeletal:  No muscle or joint pain  Endocrine:  No  diabetes or thyroid problems  Neuro:  No weakness or numbness  Psych:  Per HPI  All other ROS reviewed and negative.     Physical Examination:  Vitals:    04/29/21 1557   BP: 117/72   Pulse: 75   SpO2: 98%   Weight: 66.8 kg (147 lb 4.3 oz)   Height: 1.575 m ('5\' 2"'$ )   BMI: 26.99        General:  appears in good health, appears stated age, no distress and vital signs reviewed  Neurologic:  no gait abnormalities, motor or sensory deficits, or tremulousness    Mental Status Exam:  Level of Consciousness:  alert  Orientation:  grossly normal   Appearance: neatly dressed, dressed appropriately, well groomed and appears actual age.    Behavior:  cooperative and eye contact  Good   Motor:  no psychomotor retardation or agitation  Eye Contact good.   Speech:  normal   Memory:  grossly normal  Concentration:  good.   Mood:  "Really good"   Affect:  stable, appropriate and mood-congruent   Thought Process:  linear.   Thought Content:  appropriate No paranoia, delusions, or phobias stated  Perception:   no hallucinations endorsed  Suicidal Ideation:  none    Homicidal Ideation:  none  Insight:  good.   Judgement:  good.  Cognition:  good abstract ability.   Fund of Knowledge:  Appropriate for education level.      Labs:  No results for input(s): TSH, AST, ALT, HA1C, GLUCOSEFAST, CHOLESTEROL, HDLCHOL, LDLCHOL, LDLCHOLDIR, TRIG in the last 13140 hours.    Assessment: This is a 56 y.o. female presenting to [facility] outpatient clinic to establish care for management of ADHD, MDD, and GAD.  Patient had been managed with Wellbutrin and Concerta by her PCP in the past, as well as Prozac briefly. MDD and GAD appear well controlled, anxiety exacerbated by complicated divorce but handling well.  Patient attempted Vyvanse but expense of medication is prohibitive.  Agreeable to transitioning back to Concerta and increasing if necessary.  Patient has no safety concerns at this time, good support from children and church group.  Because of muscle  soreness and tightness as well as improved mood for some time, will decrease Wellbutrin XL to '150mg'$  daily.      ICD-10-CM    1. Attention deficit disorder, unspecified hyperactivity presence  F98.8       2. Depression, unspecified depression type  F32.A           Medication trials:  Prozac (no benefit, didn't like it), Zoloft (very long time ago), Concerta (was switched to Vyvanse by doctor), Wellbutrin (current), Adderall (physical side effects)    Plan:    Decrease Wellbutrin XL to 150 mg daily  Continue Concerta 36 mg daily with consideration to increase further as tolerated  Labs:  CBC, BMP ordered for fatigue and muscle aches, reviewed  PDMP reviewed this visit, no data  Return in about 3 months (around 07/29/2021) for In Person Visit. Sooner if necessary.  Advised patient to call or use MyChart with any questions or concerns.   Patient was advised to call 911 and/or go to the nearest emergency department should her condition worsen or thoughts of suicide or homicide occur.      Osborne Oman, DO 04/29/2021   Resident Physician, PGY4      I discussed the case with the resident. The patient did not require direct staffing, as per insurance guidelines. The patient was seen in person at the clinic by the resident. I participated in the development of the treatment plan. I reviewed the resident's note. I agree with the findings and plan of care, as documented in the resident's note. Any exceptions/ additions are edited/noted.    Teon Hudnall N. Jeannetta Ellis, M.D.   Staff Psychiatrist  Ayda Tancredi Meribeth Mattes, MD  04/29/2021, 16:43

## 2021-06-06 ENCOUNTER — Other Ambulatory Visit (HOSPITAL_COMMUNITY): Payer: Self-pay | Admitting: Student in an Organized Health Care Education/Training Program

## 2021-06-07 MED ORDER — BUPROPION HCL XL 150 MG 24 HR TABLET, EXTENDED RELEASE
150.0000 mg | ORAL_TABLET | Freq: Every day | ORAL | 0 refills | Status: DC
Start: 2021-06-07 — End: 2021-07-08

## 2021-07-08 ENCOUNTER — Other Ambulatory Visit (HOSPITAL_COMMUNITY): Payer: Self-pay | Admitting: Student in an Organized Health Care Education/Training Program

## 2021-07-08 MED ORDER — BUPROPION HCL XL 150 MG 24 HR TABLET, EXTENDED RELEASE
150.0000 mg | ORAL_TABLET | Freq: Every day | ORAL | 0 refills | Status: DC
Start: 2021-07-08 — End: 2021-08-01

## 2021-07-09 ENCOUNTER — Other Ambulatory Visit: Payer: Self-pay

## 2021-07-09 ENCOUNTER — Encounter (HOSPITAL_BASED_OUTPATIENT_CLINIC_OR_DEPARTMENT_OTHER): Payer: Self-pay | Admitting: Family

## 2021-07-09 ENCOUNTER — Other Ambulatory Visit (HOSPITAL_COMMUNITY): Payer: Self-pay | Admitting: Student in an Organized Health Care Education/Training Program

## 2021-07-09 ENCOUNTER — Ambulatory Visit: Payer: PPO | Attending: Family | Admitting: Family

## 2021-07-09 VITALS — BP 112/78 | Ht 62.0 in | Wt 146.4 lb

## 2021-07-09 DIAGNOSIS — Z1239 Encounter for other screening for malignant neoplasm of breast: Secondary | ICD-10-CM

## 2021-07-09 DIAGNOSIS — Z01419 Encounter for gynecological examination (general) (routine) without abnormal findings: Secondary | ICD-10-CM | POA: Insufficient documentation

## 2021-07-09 NOTE — Progress Notes (Signed)
Department of Obstetrics & Gynecology    Name: April Cordova  MRN: [id number]  Date: 07/09/21    Patient seen by Hedy Jacob, APRN,NP-C    Subjective:      April Cordova is a 56 y.o. female here for routine annual exam to establish care. Moved to [place].   Follows with Dr Kingsley Spittle for primary care.   Colonoscopy age 72-WNL. Recommendation for follow up 10 years.   Current Complaints: denies   Water intake: 64 ounces daily  Caffeine: 1 large cup coffee daily  Sexually active: yes   With husband        Mutually Monogamous: yes      Problems? denies  Exercise: yes, pickle ball often    Sunscreen: yes   Seatbelts: yes  Text/drive: denies  PA/SA/MA: denies    Marital status: Married-recently  Occupation: works remote  Substance Use:   Marijuana: Denies   Illicit Drug: Denies   Chew/Snuff: Denies   Cigarettes: Denies   Vaping: Denies   Alcohol: Occ    Gynecologic History    Contraception: coitus interruptus  Interval: monthly   Duration: 4-5 days   Flow: moderate   Issues?:  Cramping day on  Last Pap: 2021 neg/neg  History of STI: denies    Last Mammogram:   Recent Results (from the past 02725 hour(s))   MAMMO UNILAT DIAGNOSTIC LEFT-ADDL VIEWS/BREAST US AS REQ BY RAD    Collection Time: 03/17/21 10:06 AM    Narrative    Computer aided detection (CAD) technology has been applied to the standard   (CC and MLO) images.    3D tomosynthesis and 2D images were acquired and reviewed    FILMS COMPARED:  Compared to: 03/10/2021 MAMMO BILATERAL SCREENING-ADDL VIEWS/BREAST US AS   REQ BY RAD, 03/07/2020 MAMMO BILATERAL SCREENING-ADDL VIEWS/BREAST US AS   REQ BY RAD, 02/21/2019 MAMMO UNILAT DIAGNOSTIC RIGHT-ADDL VIEWS/BREAST US   AS REQ BY RAD, 01/23/2019 Mammogram: Screening Bilateral, 02/08/2018   EXTERNAL MAMMOGRAM COMPARISON IMAGES, 02/05/2017 EXTERNAL MAMMOGRAM   COMPARISON IMAGES, and 02/03/2016 EXTERNAL MAMMOGRAM COMPARISON IMAGES    HISTORY:  Procedure: MAMMO UNILATERAL DIAGNOSTIC LEFT W TOMO  Reason for exam: Abnormal  finding on breast imaging      FINDINGS:  The left breast is heterogeneously dense, which may obscure small masses.    Left  Asymmetry: There is an asymmetry seen in the left breast at 5 o'clock in   the middle depth, 5 cm from the nipple. Compared to the previous study,   the asymmetry is less defined.         Impression    Ultrasound is recommended for the left breast.    BI-RADS ATLAS category (left): 0 - Incomplete: Needs Additional Imaging   Evaluation   MAMMO BILATERAL SCREENING-ADDL VIEWS/BREAST US AS REQ BY RAD    Collection Time: 03/10/21 11:24 AM    Narrative    Computer aided detection (CAD) technology has been applied to the standard   (CC and MLO) images.    3D tomosynthesis and 2D images were acquired and reviewed    FILMS COMPARED:  Compared to: 03/07/2020 MAMMO BILATERAL SCREENING-ADDL VIEWS/BREAST US AS   REQ BY RAD, 02/21/2019 MAMMO UNILAT DIAGNOSTIC RIGHT-ADDL VIEWS/BREAST US   AS REQ BY RAD, 01/23/2019 Mammogram: Screening Bilateral, 02/08/2018   EXTERNAL MAMMOGRAM COMPARISON IMAGES, 02/05/2017 EXTERNAL MAMMOGRAM   COMPARISON IMAGES, and 02/03/2016 EXTERNAL MAMMOGRAM COMPARISON IMAGES    HISTORY:  Procedure: MAMMO BILATERAL SCREENING W TOMO-ADDL VIEWS/BREAST US AS REQ  BY   RAD  Reason for exam: Encounter for screening for malignant neoplasm of breast,   unspecified screening modality      FINDINGS:  The breasts are heterogeneously dense, which may obscure small masses.    Left  Asymmetry: There is an asymmetry seen in the left breast in the middle   depth on the MLO view.     Right  There is no evidence of suspicious masses, calcifications, or other   abnormal findings.      Impression    Special Views: Spot Compression Mammography with Tomosynthesis is   recommended for the left breast.  Follow up mammogram in 1 year is recommended for the right breast.    BI-RADS ATLAS category (left): 0 - Incomplete: Needs Additional Imaging   Evaluation  BI-RADS ATLAS category (right): 1 - Negative        History  OB History     Gravida   3    Para   3    Term   3    Preterm        AB        Living           SAB        IAB        Ectopic        Multiple        Live Births                   Past Medical History:   Diagnosis Date   . ADD (attention deficit disorder)    . Anxiety    . Cancer (CMS HCC)     basal cell ca   head and shoulders   . Depression        Past Surgical History was reviewed and is negative for concern       Family Medical History:     Problem Relation (Age of Onset)    Alcohol abuse Brother    Alzheimer's/Dementia Mother    Breast Cancer Sister (24)    Cancer Father    Drug Abuse Brother          Current Outpatient Medications   Medication Sig Dispense Refill   . buPROPion (WELLBUTRIN XL) 150 mg extended release 24 hr tablet Take 1 Tablet (150 mg total) by mouth Once a day 30 Tablet 0   . buPROPion (WELLBUTRIN XL) 300 mg extended release 24 hr tablet Take 1 Tablet (300 mg total) by mouth Once a day 90 Tablet 1   . methylphenidate HCl 36 mg Oral Tablet Extended Rel 24 hr Take 1 Tablet (36 mg total) by mouth Every morning 30 Tablet 0     No current facility-administered medications for this visit.     No Known Allergies    Review of Systems  General: Denies fever, chills, fatigue, unexplained weight change  Psych: Denies mood changes such as anxiety, depression  Cardio/Resp: Denies palpitations, clotting disorders, SOB, difficulty breathing, chest congestion, chest pain  Extremities: Denies tremors, weakness, numbness, tingling, edema  GI: Denies constipation, diarrhea, hemorrhoids  GU: Denies dysuria, urinary frequency, incontinence, urinary hesitancy, urinary urgency.  Repro: Denies abnormal vaginal discharge, pelvic pain, painful intercourse.    Objective:   BP 112/78   Ht 1.575 m ('5\' 2"'$ )   Wt 66.4 kg (146 lb 6.2 oz)   LMP 06/20/2021 (Exact Date)   BMI 26.77 kg/m         General:  No acute distress, appears well.   HEENT:  Normocephalic.   Thyroid:  Normal to inspection and palpation    Lymph Nodes:  Cervical, supraclavicular, and axillary nodes normal.   Breasts:  Normal without suspicious masses, skin or nipple changes or axillary nodes.   Lungs:  Clear to auscultation bilaterally. Easy, unlabored breathing, no use of accessory muscles.    Heart:  Regular rate and rhythm, S1, S2 normal, no murmur, click, rub or gallop   Abdomen:  Soft, non-tender. No masses, no organomegaly   Extremities:  Extremities normal, atraumatic, no cyanosis or edema.   Psych:  Oriented, normal mood. Appropriate responses.    Vulva:  Normal female genitalia, no lesions   GU:  Deferred   Vagina:  Normal mucosa, no discharge, no  lesions   Cervix:  no cervical motion tenderness   Mucous:  No Discharge    Uterus:  normal size, non tender   Left Adnexa:  Normal, Non tender   Right Adnexa:  Normal, Non tender       Assessment:     Essentially normal Gyne exam for 56 y.o. female.  Bimanual exam completed.   Breast exam completed.       ICD-10-CM    1. Encounter for well woman exam with routine gynecological exam  Z01.419       2. Encounter for screening for malignant neoplasm of breast, unspecified screening modality  Z12.39           Plan:     1. Pap due 2026  2. Mammogram ordered.   3. Contraception: coitus interruptus  4. Continue to follow with PCP for all non-gyn care.   5. The patient was counseled on SBE, diet, exercise, and Vit D supplementation, and Calcium in her diet.  6. Follow up: 1 year for annual exam, before PRN  7. Notify patient with normal results via MyChart.     Hedy Jacob, APRN,NP-C

## 2021-07-10 MED ORDER — METHYLPHENIDATE ER 36 MG TABLET,EXTENDED RELEASE 24 HR
36.0000 mg | EXTENDED_RELEASE_TABLET | Freq: Every morning | ORAL | 0 refills | Status: DC
Start: 2021-07-10 — End: 2021-08-04

## 2021-08-01 ENCOUNTER — Ambulatory Visit (INDEPENDENT_AMBULATORY_CARE_PROVIDER_SITE_OTHER): Payer: PPO

## 2021-08-01 ENCOUNTER — Other Ambulatory Visit: Payer: Self-pay

## 2021-08-01 VITALS — BP 117/69 | HR 99 | Ht 62.0 in | Wt 147.9 lb

## 2021-08-01 DIAGNOSIS — F411 Generalized anxiety disorder: Secondary | ICD-10-CM

## 2021-08-01 DIAGNOSIS — F909 Attention-deficit hyperactivity disorder, unspecified type: Secondary | ICD-10-CM

## 2021-08-01 DIAGNOSIS — F329 Major depressive disorder, single episode, unspecified: Secondary | ICD-10-CM

## 2021-08-03 ENCOUNTER — Other Ambulatory Visit (HOSPITAL_COMMUNITY): Payer: Self-pay | Admitting: Student in an Organized Health Care Education/Training Program

## 2021-08-04 ENCOUNTER — Encounter (HOSPITAL_COMMUNITY): Payer: Self-pay

## 2021-08-04 MED ORDER — BUPROPION HCL XL 150 MG 24 HR TABLET, EXTENDED RELEASE
150.0000 mg | ORAL_TABLET | Freq: Every day | ORAL | 2 refills | Status: DC
Start: 2021-08-04 — End: 2021-09-11

## 2021-08-04 MED ORDER — METHYLPHENIDATE ER 36 MG TABLET,EXTENDED RELEASE 24 HR
36.0000 mg | EXTENDED_RELEASE_TABLET | Freq: Every morning | ORAL | 0 refills | Status: DC
Start: 2021-08-04 — End: 2021-10-22

## 2021-08-04 NOTE — Progress Notes (Signed)
[facility] Medicine  [facility] Medicine & Psychiatry  Outpatient Follow Up    IN PERSON VISIT     Patient name:  April Cordova   Chart number:  [id number]  Date of birth:  08/01/1965  Date of service:  08/01/2021    Identification:  April Cordova is a 56 y.o. [place] from [place] [address]    Chief Complaint:  Follow Up    Chief Complaint   Patient presents with    Medication follow up     Subjective:      April Cordova is a 56 y/o woman with a PPH of MDD, GAD, and ADHD who presents to the [facility] Resident clinic on 08/01/2021 for follow up. Patient is a TOC from Dr. Gildardo Pounds. Patient was last seen on 04/29/2021, at which time Wellbutrin was decreased from 300 mg to 150 mg due to concerns that it was causing neck soreness and stiffness and Concerta 36 mg was continued.    During today's visit, patient reports that she is "doing well." She states that she recently got married and her daughter graduated from college, both of which have been stressful, but in a good way. Her new husband is also in the midst of getting his construction business up and running, which has been difficult. In addition, they are working on restoring their house down in [place] that was destroyed during [employer]. She continues to have issues with her ex-husband, but states that she mostly tries to not communicate with him, and when she does it is via text. She can tell that she gets "moody" around her menstrual cycles, but not bad enough that it bothers her or others. She reports that her sleep has been okay, but it isn't always restful. Appetite has been good. She feels that the Concerta has helped with her concentration and motivation. She has not noticed any worsening in depression or anxiety since the Wellbutrin was decreased. She has noticed that her neck feels better, but states this may be because she has been getting Botox injections as well. She denies SI/HI.     Past Medical History:   Diagnosis Date     ADD (attention deficit disorder)     Anxiety     Cancer (CMS HCC)     basal cell ca   head and shoulders    Depression      Current Medications:  Current Outpatient Medications   Medication Sig    buPROPion (WELLBUTRIN XL) 150 mg extended release 24 hr tablet Take 1 Tablet (150 mg total) by mouth Once a day    methylphenidate HCl 36 mg Oral Tablet Extended Rel 24 hr Take 1 Tablet (36 mg total) by mouth Every morning     Physical Examination:  Vitals:    08/01/21 1406   BP: 117/69   Pulse: 99   SpO2: 97%   Weight: 67.1 kg (147 lb 14.9 oz)   Height: 1.575 m ('5\' 2"'$ )   BMI: 27.11        General:  appears in good health, appears stated age, no distress and vital signs reviewed  Neurologic:  no gait abnormalities, motor or sensory deficits, or tremulousness    Mental Status Exam:  Level of Consciousness:  alert  Orientation:  grossly normal   Appearance: neatly dressed, dressed appropriately, well groomed and appears actual age.    Behavior:  cooperative and eye contact  Good   Motor:  no psychomotor retardation or agitation  Eye Contact good.   Speech:  normal   Memory:  grossly normal  Concentration:  good.   Mood:  "Really good"   Affect:  stable, appropriate and mood-congruent   Thought Process:  linear.   Thought Content:  appropriate No paranoia, delusions, or phobias stated  Perception:   no hallucinations endorsed  Suicidal Ideation:  none    Homicidal Ideation:  none  Insight:  good.   Judgement:  good.  Cognition:  good abstract ability.   Fund of Knowledge:  Appropriate for education level.      Labs:    No results for input(s): TSH, AST, ALT, HA1C, GLUCOSEFAST, CHOLESTEROL, HDLCHOL, LDLCHOL, LDLCHOLDIR, TRIG in the last 13140 hours.    Assessment: This is a 56 y.o. female presenting to [facility] outpatient clinic to establish care for management of ADHD, MDD, and GAD.  Patient had been managed with Wellbutrin and Concerta by her PCP in the past, as well as Prozac briefly. MDD and GAD are currently well controlled,  with small exacerbations in the setting of life stressors (recent marriage, divorce, child's graduation). ADHD is well controlled on Concerta. Patient has no safety concerns at this time and has good support from children and church group.      ICD-10-CM    1. GAD (generalized anxiety disorder)  F41.1 buPROPion (WELLBUTRIN XL) 150 mg extended release 24 hr tablet      2. ADHD (attention deficit hyperactivity disorder)  F90.9 buPROPion (WELLBUTRIN XL) 150 mg extended release 24 hr tablet     methylphenidate HCl 36 mg Oral Tablet Extended Rel 24 hr      3. MDD (major depressive disorder)  F32.9 buPROPion (WELLBUTRIN XL) 150 mg extended release 24 hr tablet        Medication trials: Prozac (no benefit, didn't like it), Zoloft (very long time ago), Concerta (was switched to Vyvanse by doctor), Wellbutrin (current), Adderall (physical side effects)    Plan:    Continue Wellbutrin XL 150 mg daily  Continue Concerta 36 mg daily with consideration to increase further as tolerated  Labs: CBC, BMP ordered for fatigue and muscle aches, reviewed  PDMP reviewed this visit, no data  Return in about 3 months (around 11/01/2021). Sooner if necessary.  Advised patient to call or use MyChart with any questions or concerns.   Patient was advised to call 911 and/or go to the nearest emergency department should her condition worsen or thoughts of suicide or homicide occur.      Judeth Horn, MD    08/04/2021 14:05  Behavioral Medicine and Psychiatry, PGY2    Late Entry for 08/01/2021. I discussed the case with the resident. The patient was seen by the resident in person at the clinic. The patient did not require direct staffing, as per insurance guidelines. I participated in the development of the treatment plan. I reviewed the resident's note. I agree with the findings and plan of care, as documented in the resident's note. Any exceptions/ additions are edited/noted.    Ory Elting N. Jeannetta Ellis, M.D.   Staff Psychiatrist  Sianna Garofano Meribeth Mattes, MD, 08/04/2021, 16:27

## 2021-09-11 ENCOUNTER — Other Ambulatory Visit (HOSPITAL_COMMUNITY): Payer: Self-pay

## 2021-09-11 DIAGNOSIS — F909 Attention-deficit hyperactivity disorder, unspecified type: Secondary | ICD-10-CM

## 2021-09-11 DIAGNOSIS — F411 Generalized anxiety disorder: Secondary | ICD-10-CM

## 2021-09-11 DIAGNOSIS — F329 Major depressive disorder, single episode, unspecified: Secondary | ICD-10-CM

## 2021-09-11 MED ORDER — BUPROPION HCL XL 150 MG 24 HR TABLET, EXTENDED RELEASE
150.0000 mg | ORAL_TABLET | Freq: Every day | ORAL | 2 refills | Status: DC
Start: 2021-09-11 — End: 2022-07-14

## 2021-09-17 ENCOUNTER — Other Ambulatory Visit (INDEPENDENT_AMBULATORY_CARE_PROVIDER_SITE_OTHER): Payer: Self-pay

## 2021-09-24 ENCOUNTER — Encounter (HOSPITAL_COMMUNITY): Payer: Self-pay

## 2021-10-22 ENCOUNTER — Encounter (HOSPITAL_COMMUNITY): Payer: Self-pay

## 2021-10-22 ENCOUNTER — Other Ambulatory Visit (HOSPITAL_COMMUNITY): Payer: Self-pay

## 2021-10-22 DIAGNOSIS — F909 Attention-deficit hyperactivity disorder, unspecified type: Secondary | ICD-10-CM

## 2021-10-22 MED ORDER — METHYLPHENIDATE ER 36 MG TABLET,EXTENDED RELEASE 24 HR
36.0000 mg | EXTENDED_RELEASE_TABLET | Freq: Every morning | ORAL | 0 refills | Status: DC
Start: 2021-10-22 — End: 2021-11-21

## 2021-11-05 ENCOUNTER — Encounter (INDEPENDENT_AMBULATORY_CARE_PROVIDER_SITE_OTHER): Payer: Self-pay | Admitting: NURSE PRACTITIONER

## 2021-11-05 ENCOUNTER — Other Ambulatory Visit: Payer: Self-pay

## 2021-11-05 ENCOUNTER — Ambulatory Visit (INDEPENDENT_AMBULATORY_CARE_PROVIDER_SITE_OTHER): Payer: PPO | Admitting: NURSE PRACTITIONER

## 2021-11-05 VITALS — BP 110/60 | Temp 97.4°F | Wt 147.9 lb

## 2021-11-05 DIAGNOSIS — Z029 Encounter for administrative examinations, unspecified: Secondary | ICD-10-CM

## 2021-11-06 ENCOUNTER — Encounter (HOSPITAL_COMMUNITY): Payer: PPO

## 2021-11-06 NOTE — Progress Notes (Signed)
The patient did not appear for their appointment/or scheduled appointment was cancelled.  This office visit opened in error.

## 2021-11-07 ENCOUNTER — Encounter (HOSPITAL_COMMUNITY): Payer: Self-pay

## 2021-11-18 ENCOUNTER — Ambulatory Visit (INDEPENDENT_AMBULATORY_CARE_PROVIDER_SITE_OTHER): Payer: PPO

## 2021-11-18 ENCOUNTER — Other Ambulatory Visit
Payer: PPO | Attending: Student in an Organized Health Care Education/Training Program | Admitting: Student in an Organized Health Care Education/Training Program

## 2021-11-18 ENCOUNTER — Other Ambulatory Visit: Payer: Self-pay

## 2021-11-18 VITALS — BP 136/91 | HR 77 | Ht 62.0 in | Wt 147.7 lb

## 2021-11-18 DIAGNOSIS — Z5181 Encounter for therapeutic drug level monitoring: Secondary | ICD-10-CM | POA: Insufficient documentation

## 2021-11-18 DIAGNOSIS — F329 Major depressive disorder, single episode, unspecified: Secondary | ICD-10-CM

## 2021-11-18 DIAGNOSIS — F411 Generalized anxiety disorder: Secondary | ICD-10-CM

## 2021-11-18 DIAGNOSIS — F988 Other specified behavioral and emotional disorders with onset usually occurring in childhood and adolescence: Secondary | ICD-10-CM

## 2021-11-18 DIAGNOSIS — F909 Attention-deficit hyperactivity disorder, unspecified type: Secondary | ICD-10-CM

## 2021-11-18 LAB — DRUG SCREEN, WITH CONFIRMATION, URINE
AMPHETAMINES, URINE: NEGATIVE
BARBITURATES URINE: NEGATIVE
BENZODIAZEPINES URINE: NEGATIVE
BUPRENORPHINE URINE: NEGATIVE
CANNABINOIDS URINE: POSITIVE — AB
COCAINE METABOLITES URINE: NEGATIVE
CREATININE RANDOM URINE: 39 mg/dL — ABNORMAL LOW (ref 50–100)
ECSTASY/MDMA URINE: NEGATIVE
FENTANYL, RANDOM URINE: NEGATIVE
METHADONE URINE: NEGATIVE
OPIATES URINE (LOW CUTOFF): NEGATIVE
OXYCODONE URINE: NEGATIVE

## 2021-11-18 MED ORDER — ESCITALOPRAM 10 MG TABLET
10.0000 mg | ORAL_TABLET | Freq: Every day | ORAL | 2 refills | Status: DC
Start: 2021-11-18 — End: 2022-07-14

## 2021-11-18 NOTE — Nursing Note (Signed)
11/18/21 1502   Drug Screen Results   Amphetamine (AMP) Negative   Barbiturates (BAR) Negative   BUP - Cut off Levels 10 ng/ml Negative   Benzodiazepine (BZO) Negative   Cocaine (COC) Negative   Methamphetamine (MET) Negative   Methadone (MTD) Negative   Morphine (MOP) Negative   Oxycodone (OXY) Negative   Marijuana (THC) Negative   Ecstasy (MDMA) Negative   Phencyclidine (PCP) Negative   Temperature within range? yes   Observed no   Tester annie   Physician Paulina Fusi # [id number]   Expiration Date 08/11/2023   Internal Control Valid yes   Initials AG     Sending out for drug screen with confirmation per Judeth Horn order    Leisa Lenz, Ambulatory Care Assistant    Urine drug screen reviewed.    Allena Katz, MD 11/18/2021 15:13

## 2021-11-19 ENCOUNTER — Encounter (HOSPITAL_COMMUNITY): Payer: Self-pay

## 2021-11-19 MED ORDER — METHYLPHENIDATE ER 18 MG TABLET,EXTENDED RELEASE 24 HR
36.0000 mg | EXTENDED_RELEASE_TABLET | Freq: Every morning | ORAL | 0 refills | Status: DC
Start: 2021-11-21 — End: 2022-01-14

## 2021-11-19 NOTE — Nursing Note (Signed)
11/18/21 1502   Drug Screen Results   Amphetamine (AMP) Negative   Barbiturates (BAR) Negative   BUP - Cut off Levels 10 ng/ml Negative   Benzodiazepine (BZO) Negative   Cocaine (COC) Negative   Methamphetamine (MET) Negative   Methadone (MTD) Negative   Morphine (MOP) Negative   Oxycodone (OXY) Negative   Marijuana (THC) Positive   Ecstasy (MDMA) Negative   Phencyclidine (PCP) Negative   Temperature within range? yes   Observed no   Tester annie   Physician Marvel Plan   Lot # [id number]   Expiration Date 08/11/2023   Internal Control Valid yes   Initials AG     Pt uds result was positive for THC, MA did not type in the correct result for THC.     This is the correct flowsheet chart for the urine drug screen result (11/19/2021). Dr. Marvel Plan and Judeth Horn are aware of the corrections.     Leisa Lenz, Ambulatory Care Assistant    Reviewed above note.     Allena Katz, MD 11/19/2021 13:36

## 2021-11-20 ENCOUNTER — Encounter (HOSPITAL_COMMUNITY): Payer: Self-pay

## 2021-11-20 NOTE — Progress Notes (Signed)
[facility] Medicine  [facility] Medicine & Psychiatry  Outpatient Follow Up    IN PERSON VISIT     Patient name:  April Cordova   Chart number:  [id number]  Date of birth:  1965-11-27  Date of service:  11/18/2021    Identification:  April Cordova is a 56 y.o. female from [place] [address]    Chief Complaint:  Follow Up    Chief Complaint   Patient presents with    Follow Up     Subjective:      April Cordova is a 56 y/o female with a PPH of MDD, GAD, and ADHD who presents to the [facility] Resident Clinic on 11/18/2021 for follow up. Patient was last seen on 08/01/2021, at which time Wellbutrin 289 mg and Concerta 36 mg were continued.     During today's visit, patient reports that she is "doing alright." She has been going through several stressors recently, including an impending trip to [place] for the next two months to work on their rental house that was destroyed by [employer]. She reports that due to issues with refills, she was taking Wellbutrin 300 mg for a few weeks until her 150 mg was refilled. This caused worsening in her neck pain. 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 She is still experiencing pain above baseline since decreasing the Wellbutrin back to 150 mg and is requesting to be transitioned to a different medication. She denies any issues with sleep, appetite, concentration, and motivation. Discussed switching to Lexapro, to which she is agreeable. Explained the ADHD symptoms may worsen due to stopping Wellbutrin, but that she is to reach out if she feels that Concerta needs to be increased. She denies SI/HI/AVH.    Past Medical History:   Diagnosis Date    ADD (attention deficit disorder)     Anxiety     Cancer (CMS HCC)     basal cell ca   head and shoulders    Depression      Current Medications:    Current Outpatient Medications    Medication Sig    buPROPion (WELLBUTRIN XL) 150 mg extended release 24 hr tablet Take 1 Tablet (150 mg total) by mouth Once a day    escitalopram oxalate (LEXAPRO) 10 mg Oral Tablet Take 1 Tablet (10 mg total) by mouth Once a day    [START ON 11/21/2021] methylphenidate HCl 18 mg Oral Tablet Extended Rel 24 hr Take 2 Tablets (36 mg total) by mouth Every morning    methylphenidate HCl 36 mg Oral Tablet Extended Rel 24 hr Take 1 Tablet (36 mg total) by mouth Every morning     Physical Examination:    Vitals:    11/18/21 1404 11/18/21 1451   BP: (!) 132/90 (!) 136/91   Pulse: 77    SpO2: 97%    Weight: 67 kg (147 lb 11.3 oz)    Height: 1.575 m ('5\' 2"'$ )    BMI: 27.07       General:  appears in good health, appears stated age, no distress and vital signs reviewed  Neurologic:  no gait abnormalities, motor or sensory deficits, or tremulousness    Mental Status Exam:  Level of Consciousness:  alert  Orientation:  grossly normal   Appearance: neatly dressed, dressed appropriately, well groomed and appears actual age.  Behavior:  cooperative and eye contact  Good   Motor:  no psychomotor retardation or agitation  Eye Contact good.   Speech:  normal   Memory:  grossly normal  Concentration:  good.   Mood:  "It's been okay"  Affect:  stable, appropriate and mood-congruent   Thought Process:  linear.   Thought Content:  appropriate No paranoia, delusions, or phobias stated  Perception:   no hallucinations endorsed  Suicidal Ideation:  none    Homicidal Ideation:  none  Insight:  good.   Judgement:  good.  Cognition:  good abstract ability.   Fund of Knowledge:  Appropriate for education level.      Labs:    No results for input(s): "TSH", "AST", "ALT", "HA1C", "GLUCOSEFAST", "CHOLESTEROL", "HDLCHOL", "LDLCHOL", "LDLCHOLDIR", "TRIG" in the last 13140 hours.    Assessment: This is a 56 y.o. female presenting to [facility] outpatient clinic to establish care for management of ADHD, MDD, and GAD.  Patient had been managed with  Wellbutrin and Concerta by her PCP in the past, as well as Prozac briefly. MDD and GAD are currently well controlled, with small exacerbations in the setting of life stressors (recent marriage, divorce, child's graduation, renovating house in [place]). Patient endorsing worsening neck pain with Wellbutrin; will transition to Lexapro at this time. ADHD is well controlled on Concerta, but discussed that dose may need to be increased with stopping Wellbutrin. Patient has no safety concerns at this time and has good support from children and church group.      ICD-10-CM    1. ADHD (attention deficit hyperactivity disorder)  F90.9 POCT URINE DRUG SCREEN (AMB)     methylphenidate HCl 18 mg Oral Tablet Extended Rel 24 hr        Medication trials: Prozac (no benefit, didn't like it), Zoloft (very long time ago), Concerta (current), Wellbutrin (neck pain), Adderall (physical side effects), Vyvanse, Lexapro (current)    Plan:    Stopping Wellbutrin XL 150 mg daily  Starting Lexapro 10 mg daily  Continue Concerta 36 mg daily with consideration to increase further as tolerated  Ordered urine drug screen for stimulant use  Return in about 3 months (around 02/18/2022). Sooner if necessary.  Advised patient to call or use MyChart with any questions or concerns.   Patient was advised to call 911 and/or go to the nearest emergency department should her condition worsen or thoughts of suicide or homicide occur.    Late entry for 11/18/2021    Judeth Horn, MD    11/20/2021 14:44  Behavioral Medicine and Psychiatry, PGY2    Late encounter for 11/18/2021.I discussed the case with the resident.  The patient was seen by the resident in person.  The patient did not require direct staffing, as per insurance guidelines.  I participated in the development of the treatment plan. I reviewed the resident's note. I agree with the findings and plan of care, as documented in the resident's note.  Any exceptions/ additions are edited/noted.    Allena Katz, MD  11/21/2021, 14:29  Assistant Professor   Behavioral Medicine & Psychiatry  [address]  [place],  [address]  Phone: [phone] Fax: [phone]

## 2021-11-21 ENCOUNTER — Other Ambulatory Visit (HOSPITAL_COMMUNITY): Payer: Self-pay

## 2021-11-21 DIAGNOSIS — F909 Attention-deficit hyperactivity disorder, unspecified type: Secondary | ICD-10-CM

## 2021-11-21 LAB — CARBOXY - TETRAHYDROCANNABINOL (THC) CONFIRMATION, URINE: MARIJUANA METABOLITE: NEGATIVE ng/mL (ref <5)

## 2021-11-21 LAB — NOTES AND COMMENTS

## 2021-11-21 NOTE — Telephone Encounter (Signed)
Refill requested for the following medication(s). Last ordered:    methylphenidate HCl 36 mg Oral Tablet Extended Rel 24 hr Take 1 Tablet (36 mg total) by mouth Every morning Dispense: 30 Tablet, Refills: 0 ordered  10/22/2021     Last appointment: 11/18/2021    Follow up:    01/09/2022                              Multi State Controlled Substance Patient Full Name Report Report Date 11/21/2021   From 08/21/2021 To 11/21/2021 Date of Birth 01-Oct-1965     Prescription Count 1   Last Name [facility] First Name [place] Name                                   Patients included in report that appear to match the search criteria.   Last Name First Name Middle Name Gender Address                      Prescriber Name Prescriber [place] Name [place] Zip Rx Written Date Rx Dispense Date & Date Sold Rx Number Product Name Strength Qty Days # of Refill Sched Payment Type   [place], [place]   (Reported to Other States)    [place] [id number] [place] [id number] 10/22/2021 10/22/2021 10/22/2021 [id number] Methylphenidate HCl ER (OSM) 36 MG TBCR 36 30 30 0/0 CII            Marrian Salvage, RN  11/21/2021, 09:28

## 2021-11-25 MED ORDER — METHYLPHENIDATE ER 36 MG TABLET,EXTENDED RELEASE 24 HR
36.0000 mg | EXTENDED_RELEASE_TABLET | Freq: Every morning | ORAL | 0 refills | Status: DC
Start: 2021-11-25 — End: 2021-12-30

## 2021-12-29 ENCOUNTER — Other Ambulatory Visit (HOSPITAL_COMMUNITY): Payer: Self-pay

## 2021-12-29 DIAGNOSIS — F909 Attention-deficit hyperactivity disorder, unspecified type: Secondary | ICD-10-CM

## 2021-12-30 ENCOUNTER — Encounter (HOSPITAL_COMMUNITY): Payer: Self-pay

## 2021-12-30 MED ORDER — METHYLPHENIDATE ER 36 MG TABLET,EXTENDED RELEASE 24 HR
36.0000 mg | EXTENDED_RELEASE_TABLET | Freq: Every morning | ORAL | 0 refills | Status: DC
Start: 2021-12-30 — End: 2022-01-14

## 2021-12-30 MED ORDER — METHYLPHENIDATE ER 36 MG TABLET,EXTENDED RELEASE 24 HR
36.0000 mg | EXTENDED_RELEASE_TABLET | Freq: Every morning | ORAL | 0 refills | Status: DC
Start: 2022-02-28 — End: 2022-01-14

## 2021-12-30 MED ORDER — METHYLPHENIDATE ER 36 MG TABLET,EXTENDED RELEASE 24 HR
36.0000 mg | EXTENDED_RELEASE_TABLET | Freq: Every morning | ORAL | 0 refills | Status: DC
Start: 2022-01-29 — End: 2022-01-14

## 2022-01-09 ENCOUNTER — Encounter (HOSPITAL_COMMUNITY): Payer: PPO

## 2022-01-13 ENCOUNTER — Encounter (HOSPITAL_COMMUNITY): Payer: Self-pay

## 2022-01-14 MED ORDER — METHYLPHENIDATE ER 36 MG TABLET,EXTENDED RELEASE 24 HR
36.0000 mg | EXTENDED_RELEASE_TABLET | Freq: Every morning | ORAL | 0 refills | Status: AC
Start: 2022-01-14 — End: 2022-02-13

## 2022-01-14 MED ORDER — METHYLPHENIDATE ER 36 MG TABLET,EXTENDED RELEASE 24 HR
36.0000 mg | EXTENDED_RELEASE_TABLET | Freq: Every morning | ORAL | 0 refills | Status: AC
Start: 2022-02-13 — End: 2022-03-15

## 2022-01-14 MED ORDER — METHYLPHENIDATE ER 36 MG TABLET,EXTENDED RELEASE 24 HR
36.0000 mg | EXTENDED_RELEASE_TABLET | Freq: Every morning | ORAL | 0 refills | Status: AC
Start: 2022-03-15 — End: 2022-04-14

## 2022-01-16 ENCOUNTER — Encounter (HOSPITAL_COMMUNITY): Payer: Self-pay

## 2022-03-08 ENCOUNTER — Encounter (INDEPENDENT_AMBULATORY_CARE_PROVIDER_SITE_OTHER): Payer: Self-pay

## 2022-03-16 ENCOUNTER — Ambulatory Visit (HOSPITAL_BASED_OUTPATIENT_CLINIC_OR_DEPARTMENT_OTHER): Payer: Self-pay

## 2022-05-12 ENCOUNTER — Encounter (INDEPENDENT_AMBULATORY_CARE_PROVIDER_SITE_OTHER): Payer: Self-pay

## 2022-07-14 ENCOUNTER — Encounter (HOSPITAL_BASED_OUTPATIENT_CLINIC_OR_DEPARTMENT_OTHER): Payer: Self-pay | Admitting: Family

## 2022-07-14 ENCOUNTER — Ambulatory Visit: Payer: PPO | Attending: Family | Admitting: Family

## 2022-07-14 ENCOUNTER — Other Ambulatory Visit: Payer: Self-pay

## 2022-07-14 VITALS — BP 108/76 | Ht 62.0 in | Wt 158.3 lb

## 2022-07-14 DIAGNOSIS — Z79899 Other long term (current) drug therapy: Secondary | ICD-10-CM

## 2022-07-14 DIAGNOSIS — Z1239 Encounter for other screening for malignant neoplasm of breast: Secondary | ICD-10-CM

## 2022-07-14 DIAGNOSIS — Z01419 Encounter for gynecological examination (general) (routine) without abnormal findings: Secondary | ICD-10-CM

## 2022-07-14 DIAGNOSIS — N951 Menopausal and female climacteric states: Secondary | ICD-10-CM

## 2022-07-14 MED ORDER — VENLAFAXINE ER 37.5 MG CAPSULE,EXTENDED RELEASE 24 HR
37.5000 mg | ORAL_CAPSULE | Freq: Every day | ORAL | 0 refills | Status: DC
Start: 2022-07-14 — End: 2023-04-01

## 2022-07-14 NOTE — Progress Notes (Signed)
Department of Obstetrics & Gynecology    Name: April Cordova  MRN: [id number]  Date: 07/14/22    Patient seen by Thea Alken, APRN,NP-C    Subjective:      April Cordova is a 57 y.o. female here for routine annual exam.  Follows with Dr Curley Spice for primary care.   Colonoscopy age 29-WNL. Recommendation for follow up 10 years.   Current Complaints: night sweats that are interfering with sleep. Also reports weight gain over the last 6 months. Recently stopped Wellbutrin and Lexapro and noticed the weight gain.   Water intake: 64 ounces daily  Caffeine: 1 large cup coffee daily  Sexually active: yes   With husband        Mutually Monogamous: yes      Problems? denies  Exercise: yes, pickle ball often    Sunscreen: yes   Seatbelts: yes  Text/drive: denies  PA/SA/MA: denies    Marital status: Married  Occupation: works remote  Substance Use:   Marijuana: Denies   Illicit Drug: Denies   Chew/Snuff: Denies   Cigarettes: Denies   Vaping: Denies   Alcohol: Occ    Gynecologic History    Contraception: coitus interruptus  Interval: every other month   Duration: 4-5 days   Flow: moderate   Issues?:  Cramping   Last Pap: 2021 neg/neg  History of STI: denies    Last Mammogram:   Recent Results (from the past 54098 hour(s))   MAMMO UNILAT DIAGNOSTIC LEFT-ADDL VIEWS/BREAST US AS REQ BY RAD    Collection Time: 03/17/21 10:06 AM    Narrative    Computer aided detection (CAD) technology has been applied to the standard   (CC and MLO) images.    3D tomosynthesis and 2D images were acquired and reviewed    FILMS COMPARED:  Compared to: 03/10/2021 MAMMO BILATERAL SCREENING-ADDL VIEWS/BREAST US AS   REQ BY RAD, 03/07/2020 MAMMO BILATERAL SCREENING-ADDL VIEWS/BREAST US AS   REQ BY RAD, 02/21/2019 MAMMO UNILAT DIAGNOSTIC RIGHT-ADDL VIEWS/BREAST US   AS REQ BY RAD, 01/23/2019 Mammogram: Screening Bilateral, 02/08/2018   EXTERNAL MAMMOGRAM COMPARISON IMAGES, 02/05/2017 EXTERNAL MAMMOGRAM   COMPARISON IMAGES, and 02/03/2016 EXTERNAL  MAMMOGRAM COMPARISON IMAGES    HISTORY:  Procedure: MAMMO UNILATERAL DIAGNOSTIC LEFT W TOMO  Reason for exam: Abnormal finding on breast imaging      FINDINGS:  The left breast is heterogeneously dense, which may obscure small masses.    Left  Asymmetry: There is an asymmetry seen in the left breast at 5 o'clock in   the middle depth, 5 cm from the nipple. Compared to the previous study,   the asymmetry is less defined.         Impression    Ultrasound is recommended for the left breast.    BI-RADS ATLAS category (left): 0 - Incomplete: Needs Additional Imaging   Evaluation   MAMMO BILATERAL SCREENING-ADDL VIEWS/BREAST US AS REQ BY RAD    Collection Time: 03/10/21 11:24 AM    Narrative    Computer aided detection (CAD) technology has been applied to the standard   (CC and MLO) images.    3D tomosynthesis and 2D images were acquired and reviewed    FILMS COMPARED:  Compared to: 03/07/2020 MAMMO BILATERAL SCREENING-ADDL VIEWS/BREAST US AS   REQ BY RAD, 02/21/2019 MAMMO UNILAT DIAGNOSTIC RIGHT-ADDL VIEWS/BREAST US   AS REQ BY RAD, 01/23/2019 Mammogram: Screening Bilateral, 02/08/2018   EXTERNAL MAMMOGRAM COMPARISON IMAGES, 02/05/2017 EXTERNAL MAMMOGRAM   COMPARISON IMAGES, and 02/03/2016 EXTERNAL  MAMMOGRAM COMPARISON IMAGES    HISTORY:  Procedure: MAMMO BILATERAL SCREENING W TOMO-ADDL VIEWS/BREAST US AS REQ BY   RAD  Reason for exam: Encounter for screening for malignant neoplasm of breast,   unspecified screening modality      FINDINGS:  The breasts are heterogeneously dense, which may obscure small masses.    Left  Asymmetry: There is an asymmetry seen in the left breast in the middle   depth on the MLO view.     Right  There is no evidence of suspicious masses, calcifications, or other   abnormal findings.      Impression    Special Views: Spot Compression Mammography with Tomosynthesis is   recommended for the left breast.  Follow up mammogram in 1 year is recommended for the right breast.    BI-RADS ATLAS  category (left): 0 - Incomplete: Needs Additional Imaging   Evaluation  BI-RADS ATLAS category (right): 1 - Negative       History  OB History       Gravida   3    Para   3    Term   3    Preterm        AB        Living             SAB        IAB        Ectopic        Multiple        Live Births                   Past Medical History:   Diagnosis Date    ADD (attention deficit disorder)     Anxiety     Cancer (CMS HCC)     basal cell ca   head and shoulders    Depression        Past Surgical History was reviewed and is negative for concern       Family Medical History:       Problem Relation (Age of Onset)    Alcohol abuse Brother    Alzheimer's/Dementia Mother    Breast Cancer Sister (25)    Cancer Father    Drug Abuse Brother              No Known Allergies    Review of Systems  General: Denies fever, chills, fatigue, unexplained weight change  Psych: Denies mood changes such as anxiety, depression  Cardio/Resp: Denies palpitations, clotting disorders, SOB, difficulty breathing, chest congestion, chest pain  Extremities: Denies tremors, weakness, numbness, tingling, edema  GI: Denies constipation, diarrhea, hemorrhoids  GU: Denies dysuria, urinary frequency, incontinence, urinary hesitancy, urinary urgency.  Repro: Denies abnormal vaginal discharge, pelvic pain, painful intercourse.    Objective:   BP 108/76   Ht 1.575 m (5\' 2" )   Wt 71.8 kg (158 lb 4.6 oz)   LMP 07/07/2022 (Exact Date)   BMI 28.95 kg/m       General:     No acute distress, appears well.   HEENT:  Normocephalic.   Thyroid:  Normal to inspection and palpation   Lymph Nodes:  Cervical, supraclavicular, and axillary nodes normal.   Breasts:  Normal without suspicious masses, skin or nipple changes or axillary nodes.   Lungs:  Clear to auscultation bilaterally. Easy, unlabored breathing, no use of accessory muscles.    Heart:  Regular rate and rhythm, S1, S2 normal, no murmur, click, rub  or gallop   Abdomen:  Soft, non-tender. No masses, no  organomegaly   Extremities:  Extremities normal, atraumatic, no cyanosis or edema.   Psych:  Oriented, normal mood. Appropriate responses.    Vulva:  Normal female genitalia, no lesions   GU:  Deferred   Vagina:  Normal mucosa, no discharge, no  lesions   Cervix:  no cervical motion tenderness   Mucous:  No Discharge    Uterus:  normal size, non tender   Left Adnexa:  Normal, Non tender   Right Adnexa:  Normal, Non tender       Assessment:     Essentially normal Gyne exam for 57 y.o. female with concerns for night sweats and weight gain.   Denies any hx of HTN.   Bimanual exam completed.   Breast exam completed.       ICD-10-CM    1. Encounter for well woman exam with routine gynecological exam  Z01.419       2. Encounter for screening for malignant neoplasm of breast, unspecified screening modality  Z12.39       3. Perimenopausal vasomotor symptoms  N95.1 venlafaxine (EFFEXOR XR) 37.5 mg Oral Capsule, Sust. Release 24 hr          Plan:     Pap due 2026  Prescription for Lexapro sent to pt's pharmacy. Will start at 37.5 mg daily and then increase if tolerates well and feels the dosage needs increased. April Cordova will be going back [place] until September and will check her blood pressure to be sure it remains stable.   Mammogram ordered.   Contraception: coitus interruptus  Continue to follow with PCP for all non-gyn care.   The patient was counseled on SBE, diet, exercise, and Vit D supplementation, and Calcium in her diet.  Follow up: 3 months for medication follow up.   Notify patient with normal results via MyChart.     Thea Alken, APRN,NP-C

## 2022-07-29 ENCOUNTER — Encounter (HOSPITAL_BASED_OUTPATIENT_CLINIC_OR_DEPARTMENT_OTHER): Payer: Self-pay | Admitting: Family

## 2022-08-07 ENCOUNTER — Encounter (INDEPENDENT_AMBULATORY_CARE_PROVIDER_SITE_OTHER): Payer: Self-pay

## 2022-09-16 ENCOUNTER — Encounter (HOSPITAL_BASED_OUTPATIENT_CLINIC_OR_DEPARTMENT_OTHER): Payer: Self-pay | Admitting: Family

## 2022-10-06 ENCOUNTER — Ambulatory Visit (HOSPITAL_BASED_OUTPATIENT_CLINIC_OR_DEPARTMENT_OTHER): Payer: PPO

## 2022-11-06 ENCOUNTER — Encounter (INDEPENDENT_AMBULATORY_CARE_PROVIDER_SITE_OTHER): Payer: Self-pay

## 2022-11-17 ENCOUNTER — Ambulatory Visit (INDEPENDENT_AMBULATORY_CARE_PROVIDER_SITE_OTHER): Payer: Self-pay | Admitting: FAMILY PRACTICE

## 2022-11-17 NOTE — Telephone Encounter (Signed)
Patients husband Sharma Covert called and said that they are in [place] and his wife has done something to her lower back and cannot get out of bed. She has been having extreme pain for the last 2 days and they are looking for something to help with the pain but he knows how that goes without being seen. He would like advise on what she should do. I informed him that she should go to urgent care or the emergency room near them as we would have to see her before we could send anything in. He verbalized understanding and said that's what they would do.  [place] DelSignore 11/17/2022 12:50

## 2022-11-18 ENCOUNTER — Ambulatory Visit: Payer: PPO | Admitting: Obstetrics & Gynecology

## 2022-11-23 IMAGING — MR MRI LUMBAR SPINE WITHOUT CONTRAST
7 of 12 series · 11 of 48 positions shown · IV contrast (gadolinium)
Comparison: None.

________________________________________________________________________________________________ 
MRI LUMBAR SPINE WITHOUT CONTRAST, 11/23/2022 [DATE]: 
CLINICAL INDICATION: Lumbago With Sciatica, Left Side. Lower back pain with 
radiation to left leg. Left leg numbness for one week.
TECHNIQUE: Multiplanar, multiecho position MR images of the lumbar spine were 
performed without intravenous gadolinium enhancement. Patient was scanned on a 
1.5T magnet

[Series 101: survey · axial · 10.0mm · 1.25mm/px · z∈[-33,+201]mm · 2 of 10 slices shown (1 of 2)]
[im 1/10]
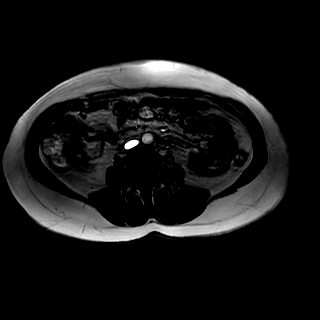
[im 10/10]
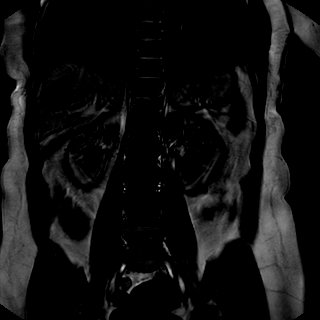

[Series 201: t2w_cor-surv · coronal · 6.0mm · 0.62mm/px · 1 of 10 slices shown]
[im 1/10]
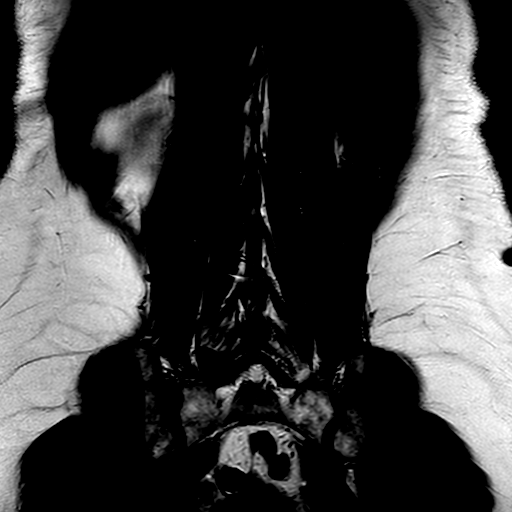

[Series 301: t1_tse_sag · sagittal · 4.0mm · 0.33mm/px · 2 of 19 slices shown]
[im 1/19]
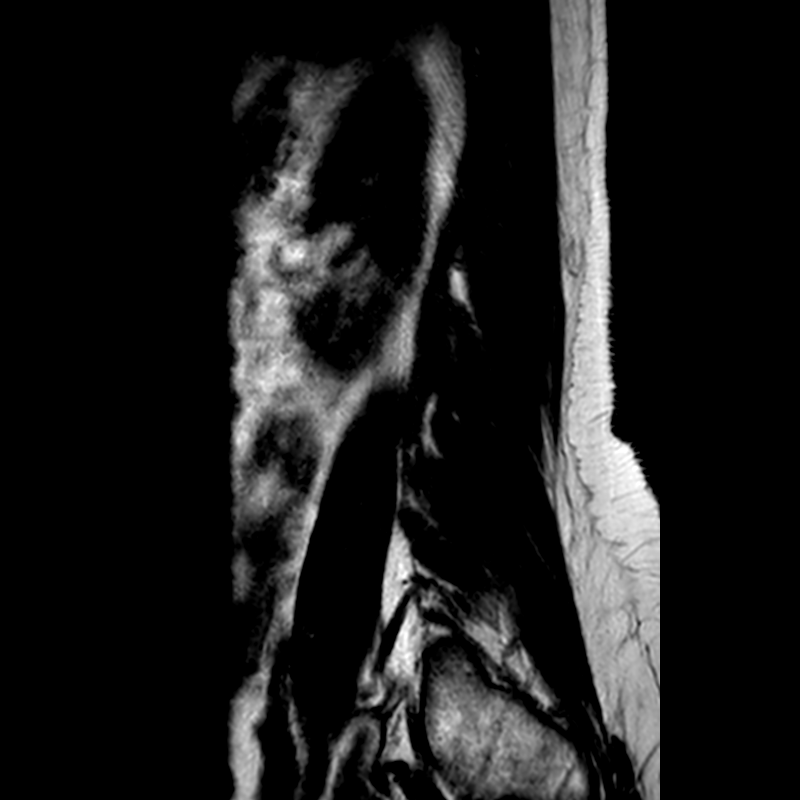
[im 19/19]
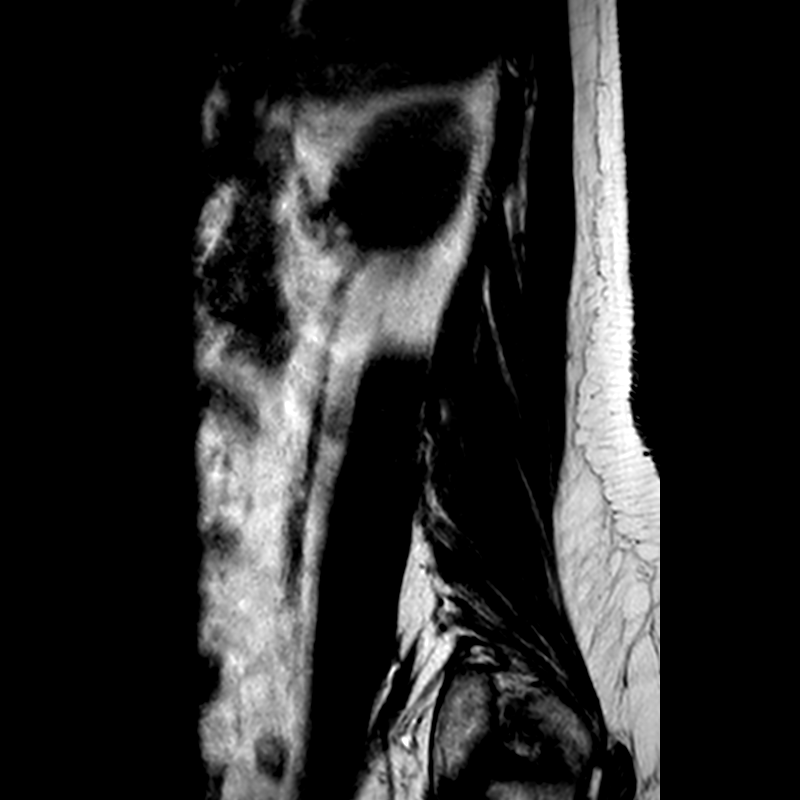

[Series 402: (id)_mdixon_tse · sagittal · 4.0mm · 0.49mm/px · 2 of 19 slices shown]
[im 1/19]
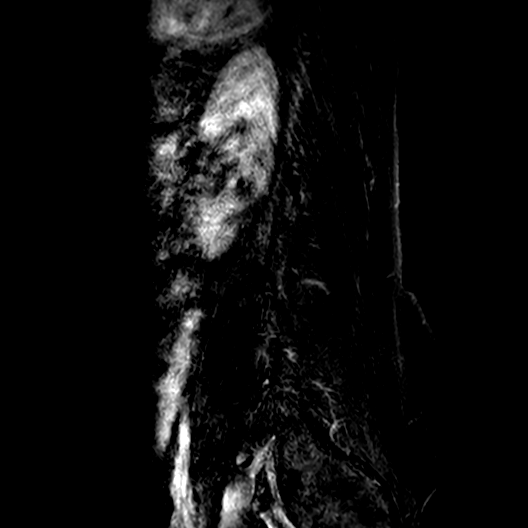
[im 19/19]
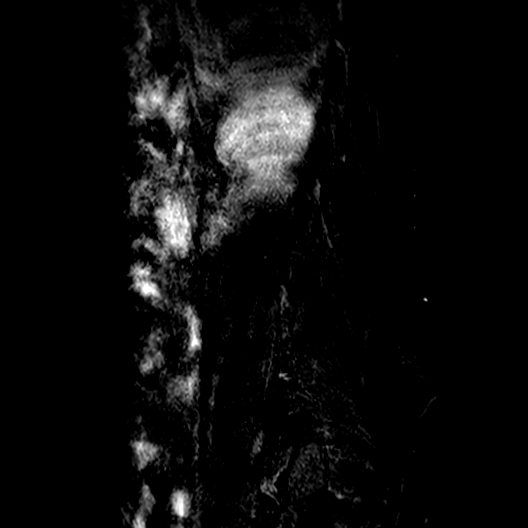

[Series 403: st2w_mdixon_tse · sagittal · 4.0mm · 0.49mm/px · 2 of 19 slices shown]
[im 1/19]
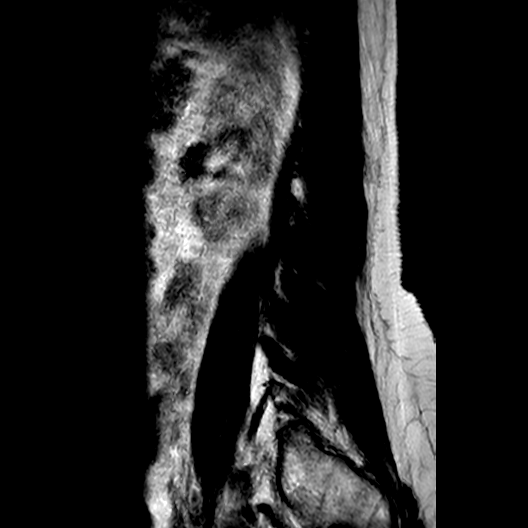
[im 19/19]
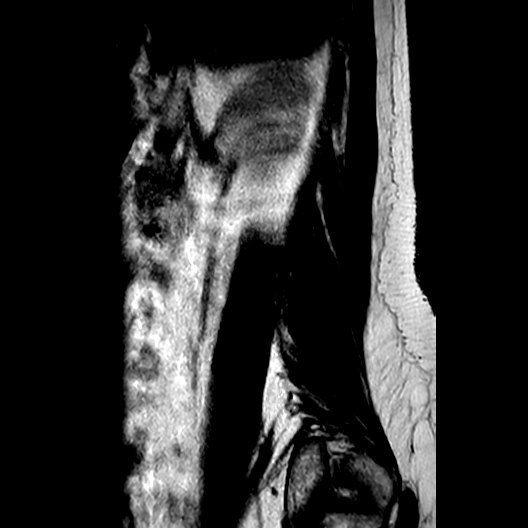

[Series 501: survey · axial · 10.0mm · 1.25mm/px · 1 of 10 slices shown (2 of 2)]
[im 1/10]
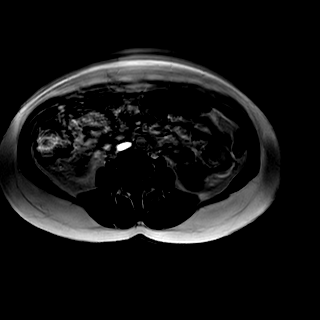

[Series 602: (id) view_ax mpr · axial · 1.0mm · 0.25mm/px · 1 of 141 slices shown]
[im 10/141]
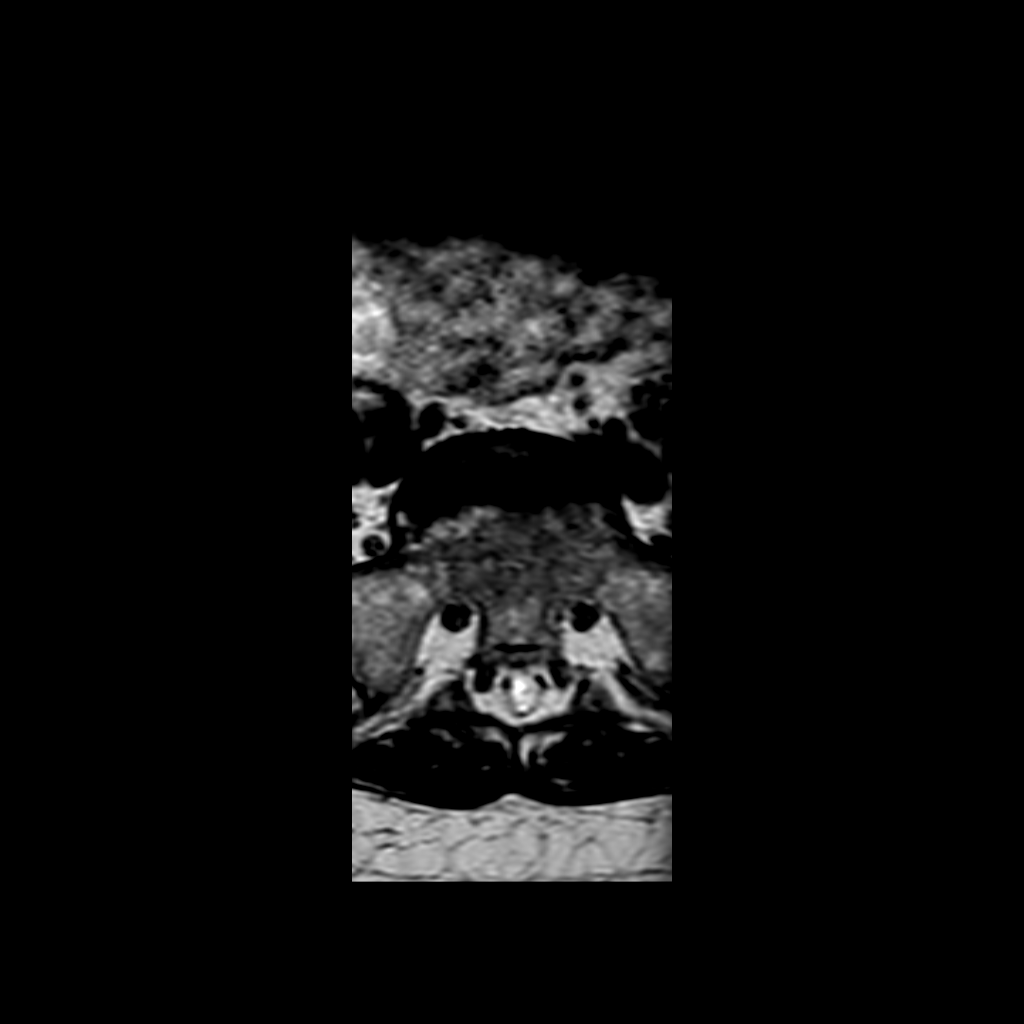

[11 of 48 positions shown; findings below may reference images not displayed]

FINDINGS: -------------------------------------------------------------------------------- 
------ 
GENERAL: 
Nomenclature is based on 5 lumbar type vertebral bodies.     
ALIGNMENT: Mild anterolisthesis of L4 on L5. Rightward curvature of the lumbar 
spine. 
VERTEBRAL BODY HEIGHT: Normal.  
MARROW SIGNAL: No focal suspect signal abnormality. 
CORD SIGNAL: Normal distal spinal cord and cauda equina.  Conus terminates at 
the L1-L2 level. Along the right side of the central canal beginning at the 
T12-L1 level and extending inferiorly to the L1-L2 level is a loculated cystic 
lesion with extension into the right L1-L2 neural foramen. This measures 2.5 x 
1.8 x 5.5 cm (AP x TRV x CC). This most likely represents large perineural root 
sleeve cyst. There is associated thinning and remodeling of the right L1 and L2 
pedicles. 
ADDITIONAL FINDINGS: None. 
Modic I-II: Modic change at L5-S1. 
Ligamentum Flavum > 2.5 mm: All levels. 
-------------------------------------------------------------------------------- 
------ 
SEGMENTAL: 
T12-L1: Perineural root sleeve cyst on the right side. No significant central 
canal narrowing.  No significant right neural foraminal narrowing. No 
significant left neural foraminal narrowing.  
L1-L2: Perineural root sleeve cyst on the right side. No significant central 
canal narrowing.  No significant right neural foraminal narrowing. No 
significant left neural foraminal narrowing.  
L2-L3: Disc bulge. No significant central canal narrowing. Mild bilateral 
subarticular recess narrowing.  No significant right neural foraminal narrowing. 
No significant left neural foraminal narrowing.  
L3-L4: Disc bulge. No significant central canal narrowing.  No significant right 
neural foraminal narrowing. No significant left neural foraminal narrowing.  
L4-L5: Disc bulge with posterior annular fissure. Bilateral facet and ligamentum 
flavum hypertrophy. Mild central canal narrowing with associated bilateral 
subarticular recess narrowing.  Very mild right neural foraminal narrowing. Mild 
left neural foraminal narrowing.  
L5-S1: Disc bulge with large left foraminal disc herniation. Bilateral facet 
hypertrophy with synovial cyst extending medially from the left facet joint. No 
significant central canal narrowing.  Moderate right neural foraminal narrowing. 
Severe left neural foraminal narrowing.  
-------------------------------------------------------------------------------- 
------
IMPRESSION: 1.  Discogenic/degenerative changes as above. 
2.  Severe left neural foraminal narrowing at L5-S1 caused by large disc 
herniation, please correlate for left L5 radiculopathy. 
3.  Incidental large right sided perineural root sleeve cyst at T12-L1 extending 
to L1-L2. This is of doubtful clinical significance however precautionary 
contrast enhanced MRI lumbar spine is recommended to ensure no pathologic 
enhancement.

## 2022-12-01 ENCOUNTER — Ambulatory Visit (INDEPENDENT_AMBULATORY_CARE_PROVIDER_SITE_OTHER): Payer: Self-pay | Admitting: FAMILY PRACTICE

## 2022-12-01 NOTE — Telephone Encounter (Signed)
Patient called and said that she injured her back and she in is [place]. She has received treatment there and got a MRI that showed she had a herniate disc. She is coming home on Sunday and wants some advise or where to turn to about seeing a specialist. I informed her that she would need the clinic she saw to give her the referral. If they wont we would have to see her at her appointment on 12/09/22. I also advised her to make sure she gets the results and images from the MRI to bring with her to the appointment. She verbalized understanding and had no other questions at this time.  [place] DelSignore 12/01/2022 10:01

## 2022-12-09 ENCOUNTER — Other Ambulatory Visit: Payer: Self-pay

## 2022-12-09 ENCOUNTER — Ambulatory Visit (INDEPENDENT_AMBULATORY_CARE_PROVIDER_SITE_OTHER): Payer: PPO | Admitting: NURSE PRACTITIONER

## 2022-12-09 ENCOUNTER — Encounter (INDEPENDENT_AMBULATORY_CARE_PROVIDER_SITE_OTHER): Payer: Self-pay | Admitting: NURSE PRACTITIONER

## 2022-12-09 ENCOUNTER — Ambulatory Visit: Payer: PPO

## 2022-12-09 ENCOUNTER — Other Ambulatory Visit: Payer: PPO | Attending: NURSE PRACTITIONER | Admitting: NURSE PRACTITIONER

## 2022-12-09 VITALS — BP 148/96 | HR 107 | Temp 97.3°F | Ht 62.0 in | Wt 158.5 lb

## 2022-12-09 DIAGNOSIS — Z23 Encounter for immunization: Secondary | ICD-10-CM

## 2022-12-09 DIAGNOSIS — R000 Tachycardia, unspecified: Secondary | ICD-10-CM

## 2022-12-09 DIAGNOSIS — M5126 Other intervertebral disc displacement, lumbar region: Secondary | ICD-10-CM

## 2022-12-09 DIAGNOSIS — Z1239 Encounter for other screening for malignant neoplasm of breast: Secondary | ICD-10-CM

## 2022-12-09 DIAGNOSIS — M5417 Radiculopathy, lumbosacral region: Secondary | ICD-10-CM

## 2022-12-09 LAB — COMPREHENSIVE METABOLIC PANEL, NON-FASTING
ALBUMIN: 3.7 g/dL (ref 3.5–5.0)
ALKALINE PHOSPHATASE: 58 U/L (ref 50–130)
ALT (SGPT): 100 U/L — ABNORMAL HIGH (ref <31)
ANION GAP: 12 mmol/L (ref 4–13)
AST (SGOT): 88 U/L — ABNORMAL HIGH (ref 11–34)
BILIRUBIN TOTAL: 0.3 mg/dL (ref 0.3–1.3)
BUN/CREA RATIO: 18 (ref 6–22)
BUN: 14 mg/dL (ref 8–25)
CALCIUM: 9.4 mg/dL (ref 8.6–10.2)
CHLORIDE: 108 mmol/L (ref 96–111)
CO2 TOTAL: 20 mmol/L — ABNORMAL LOW (ref 22–30)
CREATININE: 0.76 mg/dL (ref 0.60–1.05)
ESTIMATED GFR - FEMALE: >90 mL/min/BSA (ref >=60)
GLUCOSE: 165 mg/dL — ABNORMAL HIGH (ref 65–125)
POTASSIUM: 4 mmol/L (ref 3.5–5.1)
PROTEIN TOTAL: 7.3 g/dL (ref 6.4–8.3)
SODIUM: 140 mmol/L (ref 136–145)

## 2022-12-09 LAB — CBC WITH DIFF
BASOPHIL #: <0.1 10*3/uL (ref <=0.20)
BASOPHIL %: 0.7 %
EOSINOPHIL #: 0.1 10*3/uL (ref <=0.50)
EOSINOPHIL %: 2.2 %
HCT: 43.2 % (ref 34.8–46.0)
HGB: 14.3 g/dL (ref 11.5–16.0)
IMMATURE GRANULOCYTE #: <0.1 10*3/uL (ref <0.10)
IMMATURE GRANULOCYTE %: 0.2 % (ref 0.0–1.0)
LYMPHOCYTE #: 1.08 10*3/uL (ref 1.00–4.80)
LYMPHOCYTE %: 23.6 %
MCH: 32 pg (ref 26.0–32.0)
MCHC: 33.1 g/dL (ref 31.0–35.5)
MCV: 96.6 fL (ref 78.0–100.0)
MONOCYTE #: 0.35 10*3/uL (ref 0.20–1.10)
MONOCYTE %: 7.7 %
MPV: 9.2 fL (ref 8.7–12.5)
NEUTROPHIL #: 3 10*3/uL (ref 1.50–7.70)
NEUTROPHIL %: 65.6 %
PLATELETS: 270 10*3/uL (ref 150–400)
RBC: 4.47 10*6/uL (ref 3.85–5.22)
RDW-CV: 12.9 % (ref 11.5–15.5)
WBC: 4.6 10*3/uL (ref 3.7–11.0)

## 2022-12-09 LAB — LIPID PANEL
CHOL/HDL RATIO: 5.3
CHOLESTEROL: 242 mg/dL — ABNORMAL HIGH (ref 100–200)
HDL CHOL: 46 mg/dL — ABNORMAL LOW (ref >=50)
LDL CALC: 152 mg/dL — ABNORMAL HIGH (ref <100)
NON-HDL: 196 mg/dL — ABNORMAL HIGH (ref <=190)
TRIGLYCERIDES: 238 mg/dL — ABNORMAL HIGH (ref <150)
VLDL CALC: 45 mg/dL — ABNORMAL HIGH (ref <30)

## 2022-12-09 LAB — THYROID STIMULATING HORMONE WITH FREE T4 REFLEX: TSH: 1.454 u[IU]/mL (ref 0.350–4.940)

## 2022-12-09 MED ORDER — GABAPENTIN 100 MG CAPSULE
100.0000 mg | ORAL_CAPSULE | Freq: Two times a day (BID) | ORAL | 1 refills | Status: DC | PRN
Start: 2022-12-09 — End: 2022-12-24

## 2022-12-09 NOTE — Nursing Note (Signed)
Department of Community Practice     Venipuncture performed in office on right arm antecubital vein, dry pressure dressing was applied to site and patient tolerated it well.  Specimen was centrifuged, aliquoted as needed and specimen was labeled and packaged for transport.    Gomez Cleverly, PAT CARE [facility]  12/09/2022, 09:12

## 2022-12-09 NOTE — Nursing Note (Signed)
Patient received vaccine in clinic.  Tolerated it well, given VIS sheet and was discharged to home.  Immunization administered       Name Date Dose VIS Date Route    Influenza Vaccine, 6 month-adult 12/09/2022 0.5 mL 08/25/2019 Intramuscular    Site: Left arm    Given By: Gomez Cleverly, PAT CARE [facility]    Manufacturer: [employer]    Lot: [id number]    NDC: [id number]            Gomez Cleverly, PAT CARE [facility]  12/09/2022, 08:29

## 2022-12-10 ENCOUNTER — Encounter (INDEPENDENT_AMBULATORY_CARE_PROVIDER_SITE_OTHER): Payer: Self-pay | Admitting: NURSE PRACTITIONER

## 2022-12-10 NOTE — Progress Notes (Signed)
FAMILY MEDICINE, [facility]  [address] [place] [address]       Name: April Cordova MRN:  [id number]   Date: 12/09/2022 Age: 57 y.o.       OUTPATIENT PROGRESS NOTE     Subjective:   Patient ID:  April Cordova is a pleasant 57 y.o. female.    Chief Complaint: Herniated Disc    History of Present Illness:  History of Present Illness    The patient presents with complaints of severe back pain radiating down the right leg to the foot, along with numbness in the foot and toe. The symptoms started approximately 3.5 weeks ago after the patient sustained a herniated disc at L5 and multiple bulging discs in the lumbar and lower thoracic spine, as evidenced by recent MRI imaging. The patient reports that the pain is excruciating, describing it as sciatica with severe calf pain and numbness in the toe. The patient has been prescribed a 10-day course of prednisone, which they have completed, as well as a muscle relaxant that provides some relief when the back spasms or is overused. However, the patient states that the medications have not adequately controlled the intense, burning nerve pain radiating down the leg. The patient denies any recent trauma or inciting event, but notes the sudden onset of symptoms approximately 3.5 weeks ago. The patient's blood pressure has been elevated recently, which is abnormal for them as they typically have low blood pressure around 110/70 mmHg. The patient reports feeling "jittery" and attributes this to the recent prednisone taper, though they completed the taper over a week ago. The patient has a history of stiffness and pain with a previous antidepressant medication, which they have since discontinued.          Allergies:   No Known Allergies    Medications:   venlafaxine (EFFEXOR XR) 37.5 mg Oral Capsule, Sust. Release 24 hr, Take 1 Capsule (37.5 mg total) by mouth Once a day for 90 days    No facility-administered medications prior to visit.      Immunization History:      Immunization History   Administered Date(s) Administered    Covid-19 Vaccine,Pfizer-BioNTech,Purple Top,72yrs+ 05/31/2019, 06/21/2019    Influenza Vaccine, 6 month-adult 10/23/2015, 12/06/2019, 03/07/2021, 12/09/2022    Shingrix - Zoster Vaccine 01/31/2021, 04/01/2021       Past Medical History:     Past Medical History:   Diagnosis Date    ADD (attention deficit disorder)     Anxiety     Cancer (CMS HCC)     basal cell ca   head and shoulders    Depression            Past Surgical History:     Past Surgical History Pertinent Negatives:   Procedure Date Noted    HX APPENDECTOMY 07/09/2021    HX BLADDER REPAIR 07/09/2021    HX BLOOD TRANSFUSION 07/09/2021    HX BREAST BIOPSY 01/23/2019    HX BREAST LUMPECTOMY 01/23/2019    HX BREAST RECONSTRUCTION 01/23/2019    HX BREAST REDUCTION 01/23/2019    HX CESAREAN SECTION 07/09/2021    HX CHOLECYSTECTOMY 07/09/2021    HX CONE BIOPSY 07/09/2021    HX CYST INCISION AND DRAINAGE 01/23/2019    HX CYSTOSCOPY 07/09/2021    HX HYSTERECTOMY 03/10/2021    HX LEEP PROCEDURE 07/09/2021    HX MASTECTOMY, SIMPLE 03/10/2021    HX OOPHORECTOMY 01/23/2019    HX TONSILLECTOMY 07/09/2021    HX  TUBAL LIGATION 07/09/2021    PB POST COLPORRHAPHY,RECTUM/VAGINA 07/09/2021    PB REPAIR ENTEROCELE,VAG APPRCH 07/09/2021    PB REPR VAGINAL PROLAPSE,SACROSP LIG 07/09/2021    PB SLING OPER STRES INCONTINENCE 07/09/2021    URETERAL STENT TO GRAVITY DRAINAGE 07/09/2021           Family History:     Family Medical History:       Problem Relation (Age of Onset)    Alcohol abuse Brother    Alzheimer's/Dementia Mother    Breast Cancer Sister (42)    Cancer Father    Drug Abuse Brother              Social History:     Social History     Socioeconomic History    Marital status: Legally Separated   Tobacco Use    Smoking status: Former     Current packs/day: 0.50     Average packs/day: 0.5 packs/day for 10.0 years (5.0 ttl pk-yrs)     Types: Cigarettes    Smokeless tobacco: Never    Tobacco comments:     I  quit 1999   Vaping Use    Vaping status: Never Used   Substance and Sexual Activity    Alcohol use: Yes     Alcohol/week: 4.0 standard drinks of alcohol     Types: 4 Glasses of wine per week    Drug use: Never    Sexual activity: Yes     Partners: Male     Birth control/protection: Coitus Interruptus       Review of Systems: Other than ROS in HPI, all other systems are negative.    Objective:   Vitals:   Vitals:    12/09/22 0818   BP: (!) 148/96   Pulse: (!) 107   Temp: 36.3 C (97.3 F)   SpO2: 96%   Weight: 71.9 kg (158 lb 8.2 oz)   Height: 1.575 m (5\' 2" )   BMI: 28.99          Body mass index is 28.99 kg/m.    Constitutional: Alert, well developed, well nourished  HEENT:  Head: NC/AT    Eyes: Sclera anicteric, conjunctiva not injected    Ears: EAC normal, bilateral TMs clear    Nose: No discharge    Throat: MMM, posterior pharynx without erythema or exudate  Neck:   Supple with normal ROM, no thyromegaly, no JVD,  Cardiovascular: RRR, normal S1/S2, no murmurs/rubs/gallops  Pulmonary:  clear A&P, equal air entry, nonlabored, no wheezes/crackles/rhonchi  Abdomen:   NABS, NT/ND, soft, no HSM, no masses  Musculoskeletal:  No deformity, no injury, no edema  Neurological:   Alert, oriented x 3, no abnormal tone  Skin:     Warm, pink, dry, no rashes, no jaundice, no pallor, no cyanosis  Psychiatric:  Normal mood, affect, behavior, judgment, and thought content        Assessment & Plan:   1. Lumbar disc herniation  Discussed use of moist heat or ice, modified activities, medications, and stretching/ strengthening exercise.    -reviewed MRI  11/24/22 from [place]:  discogenic/degenerative changes  severe left neural foraminal narrowing at L5-S1 caused by large disc herniation, please correlate for left L5 radiculopathy  Incidental large right sided perineural root sleeve cyst at T12-L1 extending contrast enhanced MRI lumbar spine is recommended to ensure no pathologic enhancement    - Refer to [facility]; Future  -  Refer to Physical Therapy-External; Future  - gabapentin (NEURONTIN)  100 mg Oral Capsule; Take 1 Capsule (100 mg total) by mouth Twice per day as needed  Dispense: 60 Capsule; Refill: 1    2. Lumbosacral radiculopathy at L5    - Refer to [facility]; Future  - Refer to Physical Therapy-External; Future  - gabapentin (NEURONTIN) 100 mg Oral Capsule; Take 1 Capsule (100 mg total) by mouth Twice per day as needed  Dispense: 60 Capsule; Refill: 1    3. Need for immunization against influenza  vaccine given today- printed information provided, any questions answered today    - Flu Vaccine, 6 month-adult,0.5 mL IM (Admin)    4. Tachycardia  unclear if due to steroids given for lumbar pain  -labs collected in the office today, discussed with patient if to call the office or [facility] message if they have not been notified about the results    - CBC/DIFF; Future  - COMPREHENSIVE METABOLIC PANEL, NON-FASTING; Future  - LIPID PANEL; Future  - THYROID STIMULATING HORMONE WITH FREE T4 REFLEX; Future  - CBC/DIFF  - COMPREHENSIVE METABOLIC PANEL, NON-FASTING  - LIPID PANEL  - THYROID STIMULATING HORMONE WITH FREE T4 REFLEX    5. Encounter for screening for malignant neoplasm of breast, unspecified screening modality  orders generated  - MAMMO BILATERAL SCREENING-ADDL VIEWS/BREAST US AS REQ BY RAD; Future    Assessment & Plan     - Herniated disc  - Bulging discs  - Elevated blood pressure  - Synovial cysts  Herniated disc/Bulging discs  - Prescribe gabapentin (Neurontin) to help manage nerve pain and discomfort, starting with low dose and taking as needed, noting it can cause drowsiness as side effect  - Provide referral for physical therapy to help with back condition, noting physical therapy is usually required before considering other treatments like injections or surgery  - Initiate process of getting appointment with spine specialist or neurologist, either at [facility] [employer] [place] [employer] [facility] [employer] or in [place] if patient can  get earlier appointment there  - Obtain copy of patient's MRI scan to share with physical therapist and spine specialist for evaluation  - Recommend anti-inflammatory medications like Aleve to help reduce inflammation  Elevated blood pressure  - Order blood work to check thyroid levels and ensure elevated blood pressure and jittery feeling are not caused by underlying condition, as suspected it may be lingering effect from recently completed prednisone course        POCT Results:                   No results found for: "SARSCOVPOC", "FLUAPOC", "FLUBPOC", "RSVP2POC"      I have reviewed and confirmed the above point of care results.  Jaquelyn Bitter, APRN 12/10/2022, 09:59  Nursing Notes:   Gomez Cleverly, PAT CARE [facility]  12/09/22 [phone]  Signed        Patient received vaccine in clinic.  Tolerated it well, given VIS sheet and was discharged to home.  Immunization administered       Name Date Dose VIS Date Route    Influenza Vaccine, 6 month-adult 12/09/2022 0.5 mL 08/25/2019 Intramuscular    Site: Left arm    Given By: Gomez Cleverly, PAT CARE [facility]    Manufacturer: Gl[employer]oSmith[employer]    Lot: [id number]    NDC: [id number]            Gomez Cleverly, PAT CARE [facility]  12/09/2022, 08:29      Gomez Cleverly, PAT CARE [facility]  12/09/22 0913  Signed  Department of Community Practice     Venipuncture performed in office on right arm antecubital vein, dry pressure dressing was applied to site and patient tolerated it well.  Specimen was centrifuged, aliquoted as needed and specimen was labeled and packaged for transport.    Gomez Cleverly, PAT CARE [facility]  12/09/2022, 09:12      The patient has been educated and verbalized understanding regarding the services provided during this visit.    Health Maintenance   Topic Date Due    Hepatitis C screening  Never done    HIV Screening  Never done    Adult Tdap-Td (1 - Tdap) Never done    Hepatitis B Vaccine (1 of 3 - 19+ 3-dose series) Never done    Pap smear/HPV  02/27/2022    Breast Cancer Screening   03/10/2022    Covid-19 Vaccine (3 - 2024-25 season) 09/20/2022    NonMedicare Preventative Exam  07/14/2023    Colonoscopy  03/17/2028    Influenza Vaccine  Completed    Shingles Vaccine  Completed       Return if symptoms worsen or fail to improve.  Jaquelyn Bitter, APRN 12/10/2022 09:59    .[email or web address]

## 2022-12-23 ENCOUNTER — Other Ambulatory Visit (INDEPENDENT_AMBULATORY_CARE_PROVIDER_SITE_OTHER): Payer: Self-pay | Admitting: NURSE PRACTITIONER

## 2022-12-23 ENCOUNTER — Encounter (INDEPENDENT_AMBULATORY_CARE_PROVIDER_SITE_OTHER): Payer: Self-pay | Admitting: NURSE PRACTITIONER

## 2022-12-23 ENCOUNTER — Ambulatory Visit (INDEPENDENT_AMBULATORY_CARE_PROVIDER_SITE_OTHER): Payer: Self-pay | Admitting: FAMILY PRACTICE

## 2022-12-23 DIAGNOSIS — M5417 Radiculopathy, lumbosacral region: Secondary | ICD-10-CM

## 2022-12-23 DIAGNOSIS — M5126 Other intervertebral disc displacement, lumbar region: Secondary | ICD-10-CM

## 2022-12-23 NOTE — Telephone Encounter (Signed)
Patient called and left a voicemail on the medication refill line asking if her gabapentin dose could be increased. She also sent a MyChart message about this. She said that the 200 mg wears off.   [place] DelSignore 12/23/2022 15:29

## 2022-12-24 ENCOUNTER — Encounter (INDEPENDENT_AMBULATORY_CARE_PROVIDER_SITE_OTHER): Payer: Self-pay

## 2022-12-24 ENCOUNTER — Telehealth (INDEPENDENT_AMBULATORY_CARE_PROVIDER_SITE_OTHER): Payer: Self-pay | Admitting: ANESTHESIOLOGY

## 2022-12-24 ENCOUNTER — Other Ambulatory Visit (INDEPENDENT_AMBULATORY_CARE_PROVIDER_SITE_OTHER): Payer: Self-pay | Admitting: FAMILY PRACTICE

## 2022-12-24 ENCOUNTER — Other Ambulatory Visit (INDEPENDENT_AMBULATORY_CARE_PROVIDER_SITE_OTHER): Payer: Self-pay | Admitting: ANESTHESIOLOGY

## 2022-12-24 DIAGNOSIS — M5116 Intervertebral disc disorders with radiculopathy, lumbar region: Secondary | ICD-10-CM

## 2022-12-24 DIAGNOSIS — M5126 Other intervertebral disc displacement, lumbar region: Secondary | ICD-10-CM

## 2022-12-24 DIAGNOSIS — M51379 Other intervertebral disc degeneration, lumbosacral region without mention of lumbar back pain or lower extremity pain: Secondary | ICD-10-CM

## 2022-12-24 DIAGNOSIS — M545 Low back pain, unspecified: Secondary | ICD-10-CM

## 2022-12-24 DIAGNOSIS — M5417 Radiculopathy, lumbosacral region: Secondary | ICD-10-CM

## 2022-12-24 MED ORDER — GABAPENTIN 300 MG CAPSULE
300.0000 mg | ORAL_CAPSULE | Freq: Three times a day (TID) | ORAL | 0 refills | Status: DC
Start: 2022-12-24 — End: 2023-08-25

## 2022-12-24 MED ORDER — GABAPENTIN 300 MG CAPSULE
300.0000 mg | ORAL_CAPSULE | Freq: Three times a day (TID) | ORAL | 0 refills | Status: DC
Start: 2022-12-24 — End: 2022-12-24

## 2022-12-24 NOTE — Telephone Encounter (Signed)
increased dose to 300mg  tid, please let her know, i sent it into the pharmacy for you.  Jaquelyn Bitter CRNP

## 2022-12-24 NOTE — Telephone Encounter (Signed)
CM contacted PT and went over Non Fasting guidelines for upcoming procedure on 12.09.24 with Dr Ahmed Prima. PT states they are not currently taking antibiotics. PT had no further questions at this time Clinic phone number was provided to PT at this time.    12/24/2022  Arminda Resides, CASE MANAGER

## 2022-12-25 NOTE — Telephone Encounter (Signed)
I called and informed patient that she can increase her gabapentin to 300 mg three times a day and a new script has been sent to the pharmacy for her. She verbalized understanding and thanked me for the call back.  [place] DelSignore 12/25/2022 08:14

## 2022-12-28 ENCOUNTER — Ambulatory Visit: Payer: PPO | Attending: ANESTHESIOLOGY

## 2022-12-28 ENCOUNTER — Other Ambulatory Visit: Payer: Self-pay

## 2022-12-28 ENCOUNTER — Encounter (INDEPENDENT_AMBULATORY_CARE_PROVIDER_SITE_OTHER): Payer: Self-pay

## 2022-12-28 VITALS — Temp 97.9°F | Wt 156.6 lb

## 2022-12-28 DIAGNOSIS — M545 Low back pain, unspecified: Secondary | ICD-10-CM | POA: Insufficient documentation

## 2022-12-28 DIAGNOSIS — M5126 Other intervertebral disc displacement, lumbar region: Secondary | ICD-10-CM | POA: Insufficient documentation

## 2022-12-28 DIAGNOSIS — M51379 Other intervertebral disc degeneration, lumbosacral region without mention of lumbar back pain or lower extremity pain: Secondary | ICD-10-CM | POA: Insufficient documentation

## 2022-12-28 DIAGNOSIS — M5137 Other intervertebral disc degeneration, lumbosacral region with discogenic back pain only: Secondary | ICD-10-CM

## 2022-12-28 DIAGNOSIS — M5116 Intervertebral disc disorders with radiculopathy, lumbar region: Secondary | ICD-10-CM | POA: Insufficient documentation

## 2022-12-28 NOTE — Patient Instructions (Signed)
PAIN MANAGEMENT, [facility]  [address]  [place] [address]  Dept: [phone]  Dept Fax: [phone]  [phone]                                                 SPECIAL PROCEDURES                                     DISCHARGE FORM                                          [phone]      Please follow the instructions listed below for your procedures.  If you have questions concerning your procedure, you may call and leave a message.  Messages will be returned by the end of the next business day.  If you have an emergency, proceed to your local Emergency Department.      PROCEDURE: LEFT LUMBAR NERVE ROOT BLOCK    Do not drive a car or operate machinery until tomorrow.  Rest today and return to normal activities tomorrow.  If you are on restricted activities by your physician, please continue to follow these.  If you are not sure, contact your physician.  It is possible to experience mild numbness of the lower back and legs.  This is temporary.  If you have soreness at the injection site, the application of heat or ice may be helpful. Mild analgesics may also be used.  Steroid injections may cause temporary increase of blood sugar levels.    These instructions have been reviewed with the patient and appropriate questions have answers.  Sheryle Hail, RN 12/28/2022 09:53

## 2022-12-28 NOTE — Nursing Note (Signed)
[place] Pain Rating Scale     On a scale of 0-10, during the past 24 hours, pain has interfered with you usual activity: 8     On a scale of 0-10, during the past 24 hours, pain has interfered with your sleep: 7    On a scale of 0-10, during the past 24 hours, pain has affected your mood: 9     On a scale of 0-10, during the past 24 hours, pain has contributed to your stress: 9     On a scale of 0-10, what is your overall pain Rating: 7

## 2022-12-28 NOTE — Procedures (Signed)
PA[facility] MA[facility], [facility]  [address]  [facility] [place] [address]  Operated by [facility]  Procedure Note    Name: Lymari Azuma MRN:  [id number]   Date: 12/28/2022 DOB:  12-02-1965 (57 y.o.)         INJ ANESTHETIC/STEROID-TRANSFORAMINAL EPIDURAL,LUMBAR/SACRAL,W FLUORO,UP TO 3 LEVELS    Performed by: Deeann Dowse, MD  Authorized by: Deeann Dowse, MD    Immediately before the procedure, a time out was called:  Yes  Patient verified:  Yes  Procedure verified:  Yes  Site verified:  Yes  Number of levels::  1    Lumbar Selective Nerve Root Block    Guidance:  Fluoroscopy    Level:  L5    Side:  Left    Skin:  3cc 1% lidocaine    Contrast:  3cc M200 IsoVue    Injectate:  2cc 0.2% ropivacaine, 10 mg dexamethasone    Consent and timeout were done.  Patient was placed in the prone position.  Skin was prepped and draped in the routine fashion and sterile technique was maintained throughout.    In 30 degrees of ipsilateral oblique the neuroforamen was identified at:                   Overlying skin was anesthetized.  Then, a 22 gauge spinal needle was advanced until trajectory was established toward the superior foramen using the fluoroscope.  Next, a lateral view was obtained and the needle was advanced until it reached the superior/anterior margin of the foramen.  After negative aspiration, contrast was injected under live fluoroscopy in both AP and lateral views.  Characteristic spread along the nerve root was noted.  No vascular spread was noted.  After repeat negative aspiration the above solution was injected.  Patient tolerated this very well. Patient reported numbness,  "fullness" down left L5 dermatome into foot after injection. Patient returned to the recovery area with no complaints or apparent complications.      Deeann Dowse, MD

## 2022-12-31 ENCOUNTER — Encounter (INDEPENDENT_AMBULATORY_CARE_PROVIDER_SITE_OTHER): Payer: Self-pay

## 2023-01-04 ENCOUNTER — Other Ambulatory Visit (HOSPITAL_BASED_OUTPATIENT_CLINIC_OR_DEPARTMENT_OTHER): Payer: PPO

## 2023-01-04 ENCOUNTER — Ambulatory Visit: Payer: PPO

## 2023-01-04 ENCOUNTER — Other Ambulatory Visit: Payer: Self-pay

## 2023-01-04 VITALS — BP 98/80 | HR 89 | Temp 96.6°F | Ht 62.0 in | Wt 159.8 lb

## 2023-01-04 DIAGNOSIS — M5417 Radiculopathy, lumbosacral region: Secondary | ICD-10-CM | POA: Insufficient documentation

## 2023-01-04 DIAGNOSIS — G96191 Perineural cyst: Secondary | ICD-10-CM

## 2023-01-04 DIAGNOSIS — R278 Other lack of coordination: Secondary | ICD-10-CM

## 2023-01-04 DIAGNOSIS — M5126 Other intervertebral disc displacement, lumbar region: Secondary | ICD-10-CM | POA: Insufficient documentation

## 2023-01-04 DIAGNOSIS — M4722 Other spondylosis with radiculopathy, cervical region: Secondary | ICD-10-CM

## 2023-01-04 DIAGNOSIS — M4727 Other spondylosis with radiculopathy, lumbosacral region: Secondary | ICD-10-CM

## 2023-01-05 MED ORDER — GABAPENTIN 100 MG CAPSULE
ORAL_CAPSULE | ORAL | 0 refills | Status: DC
Start: 2023-01-05 — End: 2023-04-01

## 2023-01-07 ENCOUNTER — Ambulatory Visit (INDEPENDENT_AMBULATORY_CARE_PROVIDER_SITE_OTHER): Payer: Self-pay

## 2023-01-07 NOTE — Addendum Note (Signed)
Addended by: Virgil Benedict on: 01/07/2023 05:11 PM     Modules accepted: Orders

## 2023-01-09 ENCOUNTER — Encounter (INDEPENDENT_AMBULATORY_CARE_PROVIDER_SITE_OTHER): Payer: Self-pay
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
## 2023-01-11 ENCOUNTER — Other Ambulatory Visit (INDEPENDENT_AMBULATORY_CARE_PROVIDER_SITE_OTHER): Payer: Self-pay | Admitting: Physician Assistant

## 2023-01-11 DIAGNOSIS — R278 Other lack of coordination: Secondary | ICD-10-CM

## 2023-01-15 ENCOUNTER — Encounter (INDEPENDENT_AMBULATORY_CARE_PROVIDER_SITE_OTHER): Payer: Self-pay

## 2023-01-15 ENCOUNTER — Ambulatory Visit (INDEPENDENT_AMBULATORY_CARE_PROVIDER_SITE_OTHER): Payer: Self-pay

## 2023-01-21 ENCOUNTER — Encounter (INDEPENDENT_AMBULATORY_CARE_PROVIDER_SITE_OTHER): Payer: Self-pay | Admitting: Medical

## 2023-01-21 ENCOUNTER — Ambulatory Visit (INDEPENDENT_AMBULATORY_CARE_PROVIDER_SITE_OTHER): Payer: Self-pay | Admitting: NURSE PRACTITIONER

## 2023-01-21 ENCOUNTER — Other Ambulatory Visit: Payer: Self-pay

## 2023-01-21 ENCOUNTER — Ambulatory Visit (INDEPENDENT_AMBULATORY_CARE_PROVIDER_SITE_OTHER): Payer: PPO | Admitting: Medical

## 2023-01-21 ENCOUNTER — Ambulatory Visit
Admission: RE | Admit: 2023-01-21 | Discharge: 2023-01-21 | Disposition: A | Discharge status: Home / Self Care | Payer: PPO | Source: Ambulatory Visit | Attending: Medical | Admitting: Medical

## 2023-01-21 VITALS — BP 108/70 | HR 75 | Temp 98.1°F | Wt 161.4 lb

## 2023-01-21 DIAGNOSIS — R059 Cough, unspecified: Secondary | ICD-10-CM | POA: Insufficient documentation

## 2023-01-21 DIAGNOSIS — R5383 Other fatigue: Secondary | ICD-10-CM

## 2023-01-21 LAB — POCT RAPID COVID-19 & FLU (AMB ONLY)
COVID-19 AG: NEGATIVE
INFLUENZA TYPE A: NEGATIVE
INFLUENZA TYPE B: NEGATIVE

## 2023-01-21 MED ORDER — AMOXICILLIN 875 MG-POTASSIUM CLAVULANATE 125 MG TABLET
1.0000 | ORAL_TABLET | Freq: Two times a day (BID) | ORAL | 0 refills | Status: AC
Start: 2023-01-21 — End: 2023-01-28

## 2023-01-21 NOTE — Nursing Note (Signed)
Fatigue, "stabbing" in chest and back area.   Cough a week ago, produced a lot of mucus.  Thinks that everything is settling in her lung. Very tired.  Has been taking cough medication OTC, for 2 days now

## 2023-01-21 NOTE — Telephone Encounter (Signed)
Message  Received: Today  Hezzie Bump, PA-C  [place], [place], RN  Caller: Unspecified (Today,  9:32 AM)  Jeanene Erb and discussed, thank you    [place] DelSignore 01/21/2023 10:12

## 2023-01-21 NOTE — Telephone Encounter (Signed)
Patient called and said that she was seen with Jill Alexanders today. She wants to know if he is going to be sending her Augmentin in to the pharmacy?  [place] DelSignore 01/21/2023 09:34

## 2023-01-21 NOTE — Progress Notes (Signed)
Chief Complaint:    Chief Complaint   Patient presents with    Fatigue    Chest Congestion       HPI:  Cough a week ago mostly went away, right side like in back stabbing mostly when lay down, when take a deep breath, not bringing up, body aches and headache but no fever. In bed all day yesterday, started feeling tired a couple days ago, pretty much yesterday. Remote smoking history, no asthma. Soup yesterday, water to drink today.        Nursing Notes:   Sela Hilding, Ambulatory Care Assistant  01/21/23 [phone]  Signed  Fatigue, "stabbing" in chest and back area.   Cough a week ago, produced a lot of mucus.  Thinks that everything is settling in her lung. Very tired.  Has been taking cough medication OTC, for 2 days now     Review of Systems:  Review of Systems   Constitutional:  Positive for appetite change and fatigue. Negative for fever.   HENT:  Negative for sore throat.    Respiratory:  Positive for cough.    Cardiovascular:  Positive for chest pain. Negative for leg swelling.   Musculoskeletal:  Positive for myalgias.   Neurological:  Positive for headaches.         Medications:  Outpatient Medications Marked as Taking for the 01/21/23 encounter (Office Visit) with Hezzie Bump, PA-C   Medication Sig    gabapentin (NEURONTIN) 100 mg Oral Capsule Take 1 cap TID with current 300 mg TID to equal 400 mg TID    gabapentin (NEURONTIN) 300 mg Oral Capsule Take 1 Capsule (300 mg total) by mouth Three times a day           Physical Exam:   BP 108/70   Pulse 75   Temp 36.7 C (98.1 F) (Thermal Scan)   Wt 73.2 kg (161 lb 6 oz)   SpO2 96%   BMI 29.52 kg/m         Physical Exam  Constitutional:       General: She is not in acute distress.  HENT:      Mouth/Throat:      Mouth: Mucous membranes are moist.      Pharynx: No oropharyngeal exudate or posterior oropharyngeal erythema.   Eyes:      Extraocular Movements: Extraocular movements intact.      Conjunctiva/sclera: Conjunctivae normal.   Cardiovascular:       Rate and Rhythm: Normal rate and regular rhythm.   Pulmonary:      Effort: Pulmonary effort is normal.      Breath sounds: Normal breath sounds.   Musculoskeletal:      Cervical back: No tenderness.   Lymphadenopathy:      Cervical: No cervical adenopathy.   Skin:     General: Skin is warm and dry.   Neurological:      Mental Status: She is oriented to person, place, and time.                   Assessment/Plan:  1. Cough, unspecified type  2. Fatigue, unspecified type  - will test for disseminated viral illness, with pain also image for pneumonia or pneumothorax  - POCT Rapid Covid & Flu ([facility])  - XR CHEST PA AND LATERAL; Future             The onsite collaborating physician for this visit was Dr. Leanor Rubenstein, MD

## 2023-01-26 ENCOUNTER — Other Ambulatory Visit: Payer: Self-pay

## 2023-01-29 ENCOUNTER — Telehealth (INDEPENDENT_AMBULATORY_CARE_PROVIDER_SITE_OTHER): Payer: Self-pay | Admitting: ANESTHESIOLOGY

## 2023-01-29 NOTE — Telephone Encounter (Signed)
CM contacted PT and went over Non Fasting guidelines for upcoming procedure on 01.16.25 with Dr Ahmed Prima. PT states they are not currently taking antibiotics. PT had no further questions at this time Clinic phone number was provided to PT at this time.    01/29/2023  Arminda Resides, CASE MANAGER

## 2023-02-01 ENCOUNTER — Other Ambulatory Visit: Payer: Self-pay

## 2023-02-01 ENCOUNTER — Ambulatory Visit
Admission: RE | Admit: 2023-02-01 | Discharge: 2023-02-01 | Disposition: A | Discharge status: Home / Self Care | Payer: PPO | Source: Ambulatory Visit | Attending: Physician Assistant | Admitting: Physician Assistant

## 2023-02-01 DIAGNOSIS — M5126 Other intervertebral disc displacement, lumbar region: Secondary | ICD-10-CM | POA: Insufficient documentation

## 2023-02-01 DIAGNOSIS — R278 Other lack of coordination: Secondary | ICD-10-CM | POA: Insufficient documentation

## 2023-02-01 DIAGNOSIS — M5417 Radiculopathy, lumbosacral region: Secondary | ICD-10-CM | POA: Insufficient documentation

## 2023-02-01 DIAGNOSIS — G96191 Perineural cyst: Secondary | ICD-10-CM | POA: Insufficient documentation

## 2023-02-02 ENCOUNTER — Encounter (INDEPENDENT_AMBULATORY_CARE_PROVIDER_SITE_OTHER): Payer: Self-pay | Admitting: Physician Assistant

## 2023-02-02 ENCOUNTER — Other Ambulatory Visit (INDEPENDENT_AMBULATORY_CARE_PROVIDER_SITE_OTHER): Payer: Self-pay | Admitting: Physician Assistant

## 2023-02-02 ENCOUNTER — Ambulatory Visit (INDEPENDENT_AMBULATORY_CARE_PROVIDER_SITE_OTHER): Payer: Self-pay

## 2023-02-02 DIAGNOSIS — M5417 Radiculopathy, lumbosacral region: Secondary | ICD-10-CM

## 2023-02-02 DIAGNOSIS — G96191 Perineural cyst: Secondary | ICD-10-CM

## 2023-02-02 NOTE — Nursing Note (Signed)
Dr. Thana Ates consulted neurosurgery consulted. 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 Recommend either MRI with contrast or CT myelogram. Discussed both. Patient would like open MRI in [place] at [facility] if possible. Will place orders and send patient message via MyChart regarding dural ectasia. Silva Bandy, PA-C  02/02/2023

## 2023-02-04 ENCOUNTER — Ambulatory Visit (INDEPENDENT_AMBULATORY_CARE_PROVIDER_SITE_OTHER): Payer: Self-pay | Admitting: ANESTHESIOLOGY

## 2023-02-10 ENCOUNTER — Ambulatory Visit (HOSPITAL_COMMUNITY): Payer: Self-pay

## 2023-02-15 ENCOUNTER — Ambulatory Visit (HOSPITAL_COMMUNITY): Payer: Self-pay

## 2023-02-17 ENCOUNTER — Telehealth (INDEPENDENT_AMBULATORY_CARE_PROVIDER_SITE_OTHER): Payer: Self-pay | Admitting: ANESTHESIOLOGY

## 2023-02-17 NOTE — Telephone Encounter (Signed)
CM contacted PT and went over Non Fasting guidelines for upcoming procedure on 02.06.25 with Dr Ahmed Prima. PT states they are not currently taking antibiotics. PT had no further questions at this time Clinic phone number was provided to PT at this time.    02/17/2023  Arminda Resides, CASE MANAGER

## 2023-02-25 ENCOUNTER — Ambulatory Visit (INDEPENDENT_AMBULATORY_CARE_PROVIDER_SITE_OTHER): Payer: PPO | Admitting: ANESTHESIOLOGY

## 2023-02-26 ENCOUNTER — Encounter (INDEPENDENT_AMBULATORY_CARE_PROVIDER_SITE_OTHER): Payer: Self-pay | Admitting: Physician Assistant

## 2023-02-26 ENCOUNTER — Other Ambulatory Visit: Payer: Self-pay

## 2023-02-26 ENCOUNTER — Ambulatory Visit (INDEPENDENT_AMBULATORY_CARE_PROVIDER_SITE_OTHER): Payer: Self-pay | Admitting: NURSE PRACTITIONER

## 2023-02-26 NOTE — Telephone Encounter (Signed)
-----   Message from Derald Macleod sent at 02/26/2023  2:34 PM EST -----  Would like to know if we do hormone testing for menopause, if we do she would like to have an appt to do this

## 2023-03-02 ENCOUNTER — Other Ambulatory Visit (INDEPENDENT_AMBULATORY_CARE_PROVIDER_SITE_OTHER): Payer: Self-pay | Admitting: Physician Assistant

## 2023-03-02 MED ORDER — CYCLOBENZAPRINE 5 MG TABLET
5.0000 mg | ORAL_TABLET | Freq: Every evening | ORAL | 0 refills | Status: DC | PRN
Start: 2023-03-02 — End: 2023-10-06

## 2023-03-02 NOTE — Nursing Note (Signed)
Spoke with patient regarding MRI results. 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 Will continue to monitor. Lumbar MRI w/ contrast without progression. Neurosurgery consulted regarding dural ectasia. Do not recommend surgical intervention for this. Dr. Thana Ates recommend TLIF L5-S1. Patient reports significant improvement with recent initiation of physical therapy. She is surprised at how well this has helped. She reports strength in toe is restored. She is using elliptical 25 minutes. She does endorse some stiffness with prolonged sitting which is improved with mobility. She also reports stiffness and difficulty getting comfortable at night. She is taking gabapentin only on prn basis but does cause cognitive side effects. Will trial Flexeril 5 mg at bedtime. Plan to follow up with patient in 4 weeks in clinic. She was instructed to contact clinic if symptoms return in severity. She voiced understanding. April Bandy, PA-C  03/02/2023

## 2023-03-09 ENCOUNTER — Encounter (INDEPENDENT_AMBULATORY_CARE_PROVIDER_SITE_OTHER): Payer: Self-pay | Admitting: NURSE PRACTITIONER

## 2023-03-09 DIAGNOSIS — M542 Cervicalgia: Secondary | ICD-10-CM

## 2023-03-09 DIAGNOSIS — M5126 Other intervertebral disc displacement, lumbar region: Secondary | ICD-10-CM

## 2023-03-09 DIAGNOSIS — M5417 Radiculopathy, lumbosacral region: Secondary | ICD-10-CM

## 2023-03-17 ENCOUNTER — Telehealth (INDEPENDENT_AMBULATORY_CARE_PROVIDER_SITE_OTHER): Payer: Self-pay | Admitting: ANESTHESIOLOGY

## 2023-03-17 NOTE — Telephone Encounter (Signed)
 CM contacted PT and went over Non Fasting guidelines for upcoming procedure on 02.27.25 with Dr Ahmed Prima. PT states they are not currently taking antibiotics. PT had no further questions at this time Clinic phone number was provided to PT at this time.    03/17/2023  April Cordova, CASE MANAGER

## 2023-03-18 ENCOUNTER — Encounter (INDEPENDENT_AMBULATORY_CARE_PROVIDER_SITE_OTHER): Payer: Self-pay

## 2023-03-18 ENCOUNTER — Other Ambulatory Visit: Payer: Self-pay

## 2023-03-18 ENCOUNTER — Ambulatory Visit: Payer: PPO | Attending: Physician Assistant

## 2023-03-18 VITALS — Temp 97.2°F | Wt 157.6 lb

## 2023-03-18 DIAGNOSIS — R278 Other lack of coordination: Secondary | ICD-10-CM | POA: Insufficient documentation

## 2023-03-18 DIAGNOSIS — M5126 Other intervertebral disc displacement, lumbar region: Secondary | ICD-10-CM | POA: Insufficient documentation

## 2023-03-18 DIAGNOSIS — M5417 Radiculopathy, lumbosacral region: Secondary | ICD-10-CM | POA: Insufficient documentation

## 2023-03-18 DIAGNOSIS — M545 Low back pain, unspecified: Secondary | ICD-10-CM | POA: Insufficient documentation

## 2023-03-18 NOTE — Procedures (Addendum)
 [facility]  [address]  [facility]  [id number]  Operated by [facility]  Procedure Note    Name: April Cordova MRN:  [id number]   Date: 03/18/2023 DOB:  Sep 30, 1965 (57 y.o.)         INJ ANESTHETIC/STEROID-TRANSFORAMINAL EPIDURAL,LUMBAR/SACRAL,W FLUORO,UP TO 3 LEVELS    Performed by: Deeann Dowse, MD  Authorized by: Silva Bandy, PA-C    Immediately before the procedure, a time out was called:  Yes  Patient verified:  Yes  Procedure verified:  Yes  Site verified:  Yes  Number of levels::  1    Lumbar Transforaminal Epidural Steroid Injection with Fluoro    Pre Procedure Dx:  Lumbar radiculopathy    Post Procedure Dx:  Lumbar radiculoapthy    Guidance:  Fluoroscopy    Fellow: Billey Gosling DO    Level: L5    Side: LEFT (ADDENDED)    Skin: 4cc 1% lidocaine    Contrast: 3cc M200 IsoVue    Injectate: 3cc 0.25% bupivacaine, 10 mg dexamethasone    Consent and timeout were done.  Patient was placed in the prone position.  Skin was prepped and draped in the routine fashion and sterile technique was maintained throughout.    In 30 degrees of ipsilateral oblique the fluoroscope was used to identify the neuroforamen  at: L5                   Overlying skin was anesthetized.  Then, a 22 gauge spinal needle was advanced until trajectory was established toward the superior foramen.  Next, a lateral view was obtained and the needle was advanced until it reached the superior/anterior margin of the foramen.  After negative aspiration, contrast was injected under live fluoroscopy in both AP and lateral views.  Characteristic epidural spread was noted.  No vascular spread was noted.  After repeat negative aspiration the above solution was injected.  Patient tolerated this very well.  Patient returned to the recovery area with no complaints or apparent complications.    Deeann Dowse, MD

## 2023-03-18 NOTE — Nursing Note (Signed)
Pain and Function:     [place] Pain Rating Scale     On a scale of 0-10, during the past 24 hours, pain has interfered with you usual activity: 5     On a scale of 0-10, during the past 24 hours, pain has interfered with your sleep: 3    On a scale of 0-10, during the past 24 hours, pain has affected your mood: 3     On a scale of 0-10, during the past 24 hours, pain has contributed to your stress: 5     On a scale of 0-10, what is your overall pain Rating: 3

## 2023-03-18 NOTE — Patient Instructions (Signed)
 [facility]  [address]  [place] [address]  Dept: [phone]  Dept Fax: [phone]  [phone]                                                 SPECIAL PROCEDURES                                     DISCHARGE FORM                                          [id number]      Please follow the instructions listed below for your procedures.  If you have questions concerning your procedure, you may call and leave a message.  Messages will be returned by the end of the next business day.  If you have an emergency, proceed to your local Emergency Department.      PROCEDURE: LEFT LUMBAR SELECTIVE NERVE ROOT BLOCK    Do not drive a car or operate machinery until tomorrow.  Rest today and return to normal activities tomorrow.  If you are on restricted activities by your physician, please continue to follow these.  If you are not sure, contact your physician.  It is possible to experience mild numbness of the lower back and legs.  This is temporary.  If you have soreness at the injection site, the application of heat or ice may be helpful. Mild analgesics may also be used.  In case of severe headache; lie flat to decrease it.  Increase all fluids especially those with caffeine.  Mild analgesics are also appropriate.  If headache is not relieved by these measures, contact the Pain Clinic.  Steroid injections may cause temporary increase of blood sugar levels.    These instructions have been reviewed with the patient and appropriate questions have answers.  Rogers Blocker, RT 03/18/2023 14:41

## 2023-03-25 ENCOUNTER — Other Ambulatory Visit: Payer: Self-pay

## 2023-03-25 ENCOUNTER — Encounter (HOSPITAL_BASED_OUTPATIENT_CLINIC_OR_DEPARTMENT_OTHER): Payer: Self-pay | Admitting: Obstetrics & Gynecology

## 2023-03-25 ENCOUNTER — Ambulatory Visit (HOSPITAL_BASED_OUTPATIENT_CLINIC_OR_DEPARTMENT_OTHER): Admitting: Gynecology

## 2023-03-25 ENCOUNTER — Ambulatory Visit: Payer: PPO | Attending: Obstetrics & Gynecology | Admitting: Obstetrics & Gynecology

## 2023-03-25 VITALS — BP 116/84 | Ht 62.0 in | Wt 158.5 lb

## 2023-03-25 DIAGNOSIS — N951 Menopausal and female climacteric states: Secondary | ICD-10-CM | POA: Insufficient documentation

## 2023-03-25 DIAGNOSIS — R232 Flushing: Secondary | ICD-10-CM | POA: Insufficient documentation

## 2023-03-25 LAB — FSH: FSH: 58.2 m[IU]/mL

## 2023-03-25 LAB — ESTRADIOL: ESTRADIOL: 101 pg/mL

## 2023-03-25 MED ORDER — MEDROXYPROGESTERONE 10 MG TABLET
10.0000 mg | ORAL_TABLET | ORAL | 11 refills | Status: DC
Start: 2023-03-25 — End: 2023-11-13

## 2023-03-25 MED ORDER — ESTRADIOL 0.05 MG/24 HR WEEKLY TRANSDERMAL PATCH
0.0500 mg | MEDICATED_PATCH | TRANSDERMAL | 3 refills | Status: DC
Start: 2023-03-25 — End: 2023-04-06

## 2023-03-25 NOTE — Progress Notes (Signed)
 [facility]  Ob/Gyn Outpatient Visit    Subjective:   April Cordova is a 58 y.o. [id number] female who presents for gyn visit.   The patient is complaining of worse hot flashes.  The patient is sexually active.  No vaginal bleeding or discharge,   No urinary or gastrointestinal symptoms.     OB History:  OB History       Gravida   5    Para   3    Term   3    Preterm        AB   2    Living   3         SAB   2    IAB        Ectopic        Multiple        Live Births                      Gyn History:  Menarche age: 84  Menstrual History: Patient's last menstrual period was 12/20/2022., irregular, Flow is Irregular  Dysmennorhea No,  Intermenstrual bleeding No.   Contraception: none  Last Pap Smear:normal February/2021   Cervical Procedure: No  STD History: No   Sexual HX:  Sexually active, dyspareunia No    Past Medical History:  Past Medical History:   Diagnosis Date    ADD (attention deficit disorder)     Anxiety     Cancer (CMS HCC)     basal cell ca   head and shoulders    Depression            Current Medications:  cyclobenzaprine (FLEXERIL) 5 mg Oral Tablet, Take 1 Tablet (5 mg total) by mouth Every night as needed for Muscle spasms (Patient not taking: Reported on 03/18/2023)  gabapentin (NEURONTIN) 100 mg Oral Capsule, Take 1 cap TID with current 300 mg TID to equal 400 mg TID (Patient not taking: Reported on 03/18/2023)  gabapentin (NEURONTIN) 300 mg Oral Capsule, Take 1 Capsule (300 mg total) by mouth Three times a day (Patient not taking: Reported on 03/18/2023)  HYDROcodone-acetaminophen (NORCO) 5-325 mg Oral Tablet, Take 1 Tablet by mouth Every 4 hours as needed for Pain (Patient not taking: Reported on 03/18/2023)  venlafaxine (EFFEXOR XR) 37.5 mg Oral Capsule, Sust. Release 24 hr, Take 1 Capsule (37.5 mg total) by mouth Once a day for 90 days    No facility-administered medications prior to visit.      Surgical History  Past Surgical History Pertinent Negatives:   Procedure Date Noted     HX APPENDECTOMY 07/09/2021    HX BLADDER REPAIR 07/09/2021    HX BLOOD TRANSFUSION 07/09/2021    HX BREAST BIOPSY 01/23/2019    HX BREAST LUMPECTOMY 01/23/2019    HX BREAST RECONSTRUCTION 01/23/2019    HX BREAST REDUCTION 01/23/2019    HX CESAREAN SECTION 07/09/2021    HX CHOLECYSTECTOMY 07/09/2021    HX CONE BIOPSY 07/09/2021    HX CYST INCISION AND DRAINAGE 01/23/2019    HX CYSTOSCOPY 07/09/2021    HX HYSTERECTOMY 03/10/2021    HX LEEP PROCEDURE 07/09/2021    HX MASTECTOMY, SIMPLE 03/10/2021    HX OOPHORECTOMY 01/23/2019    HX TONSILLECTOMY 07/09/2021    HX TUBAL LIGATION 07/09/2021    PB POST COLPORRHAPHY,RECTUM/VAGINA 07/09/2021    PB REPAIR ENTEROCELE,VAG APPRCH 07/09/2021    PB REPR VAGINAL PROLAPSE,SACROSP LIG 07/09/2021    PB SLING OPER STRES  INCONTINENCE 07/09/2021    URETERAL STENT TO GRAVITY DRAINAGE 07/09/2021             Family History:  Family Medical History:       Problem Relation (Age of Onset)    Alcohol abuse Brother    Alzheimer's/Dementia Mother    Breast Cancer Sister (66)    Cancer Father    Drug Abuse Brother               Allergies:  No Known Allergies    Social History:  Social History     Tobacco Use    Smoking status: Former     Current packs/day: 0.50     Average packs/day: 0.5 packs/day for 10.0 years (5.0 ttl pk-yrs)     Types: Cigarettes    Smokeless tobacco: Never    Tobacco comments:     I quit 1999   Vaping Use    Vaping status: Never Used   Substance Use Topics    Alcohol use: Yes     Alcohol/week: 4.0 standard drinks of alcohol     Types: 4 Glasses of wine per week    Drug use: Never       Concern    Drug Use No    Marital Status: Married    Abuse/Domestic Violence No       Review of Systems:  Constitutional: negative, hot flashes  Respiratory: negative  Cardiovascular: negative  Gastrointestinal: negative  Genitourinary:negative  Integument/breast: negative  Musculoskeletal:negative  Neurological: negative  Behavioral/Psych: negative       Objective:   Vitals: BP 116/84   Ht  1.575 m (5\' 2" )   Wt 71.9 kg (158 lb 8.2 oz)   LMP 12/20/2022   BMI 28.99 kg/m       General: appears in good health and appears stated age      Assessment:   April Cordova is a 58 y.o. female [id number] who presents for GYN visit.        ICD-10-CM    1. Perimenopausal  N95.1 FSH     ESTRADIOL      2. Hot flashes  R23.2             Counseling:   On the day of the encounter, a total of 25 minutes was spent on this patient encounter including review of historical information, examination, documentation and post-visit activities. The time documented excludes procedural time.     Plan:   FSH and Estradiol.  Will start on HRT.  All questions answered.  The patient will call with any additional questions.      Karrie Doffing, MD 03/25/2023, 13:29

## 2023-03-26 ENCOUNTER — Other Ambulatory Visit (INDEPENDENT_AMBULATORY_CARE_PROVIDER_SITE_OTHER): Payer: Self-pay

## 2023-03-26 DIAGNOSIS — Z1239 Encounter for other screening for malignant neoplasm of breast: Secondary | ICD-10-CM

## 2023-03-27 ENCOUNTER — Ambulatory Visit (INDEPENDENT_AMBULATORY_CARE_PROVIDER_SITE_OTHER): Payer: Self-pay | Admitting: NURSE PRACTITIONER

## 2023-03-29 ENCOUNTER — Other Ambulatory Visit: Payer: Self-pay

## 2023-03-29 ENCOUNTER — Ambulatory Visit: Payer: Self-pay

## 2023-03-29 DIAGNOSIS — M5416 Radiculopathy, lumbar region: Secondary | ICD-10-CM

## 2023-03-29 DIAGNOSIS — M4807 Spinal stenosis, lumbosacral region: Secondary | ICD-10-CM

## 2023-03-29 DIAGNOSIS — M5417 Radiculopathy, lumbosacral region: Secondary | ICD-10-CM | POA: Insufficient documentation

## 2023-03-29 DIAGNOSIS — M4802 Spinal stenosis, cervical region: Secondary | ICD-10-CM

## 2023-03-29 DIAGNOSIS — M50322 Other cervical disc degeneration at C5-C6 level: Secondary | ICD-10-CM

## 2023-03-29 DIAGNOSIS — M50323 Other cervical disc degeneration at C6-C7 level: Secondary | ICD-10-CM

## 2023-03-29 DIAGNOSIS — M5137 Other intervertebral disc degeneration, lumbosacral region with discogenic back pain only: Secondary | ICD-10-CM

## 2023-03-31 NOTE — Progress Notes (Cosign Needed)
 PATIENT NAME: April Cordova, April Cordova   HOSPITAL NUMBER:  [id number]  DATE OF SERVICE: 03/29/2023  DATE OF BIRTH:  1965-04-08    PROGRESS NOTE    CHIEF COMPLAINT:  Lumbar radiculopathy followup, MRI followup.    SUBJECTIVE:  This 58 year old female presents today for followup for left lumbar radiculopathy.  Dr. Thana Ates previously recommended a TLIF at L5-S1.  Luckily, the patient saw significant improvement with initiation of physical therapy and it was ultimately decided not to proceed with surgery.  She does note some occasional tingling into the foot and the toe, but otherwise her symptoms are markedly improved.  She does note some persistent weakness in the left toe, but she states that she is able to live with the sensation that she has at this time.  She does get some occasional pressure in the low back but otherwise she is doing very well.  She has currently experienced some issues with continuation of physical therapy secondary to networking issues, but she is continuing to do her home exercise program. Updated cervical and thoracic MRI to look for any evidence of cord compression.  She notes that she has trouble with holding onto objects and dropping things at times secondary to decreased grip bilaterally.  She also notes trouble opening things.  She is not endorsing any issues with her balance.  She denies any urine or bowel changes.  She does get some increased neck pain with increased caffeine intake.    OBJECTIVE:  The patient is sitting comfortably in chair in no acute distress.  She has full sensation in upper and lower extremities.  Full strength at 5 out of 5 throughout with the exception of the left EHL, remains at 4+ over 5.  Unable to appreciate hyperreflexia.  Negative Hoffmann's.  Negative clonus.  Negative finger escape sign.  Negative grip and release testing.    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  Lumbar MRI positive for dural ectasia.    ASSESSMENT AND  PLAN:  This is a very pleasant 58 year old female who we were initially following for left lumbar radiculopathy, presents today for followup.  Overall, the patient is significantly improved compared to where she was initially.  She does have some residual weakness of the left EHL as well as some mild occasional tingling into the foot and toe.  This is tolerable and the patient feels that she can live with that.  Should symptoms return, we could certainly repeat injection or consider previously mentioned TLIF.  She is also reporting some mild myelopathic symptoms.  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  Signs and symptoms to monitor.  Regarding surgical picture for her cervical spine, this would be a single-level ACDF at C5-C6, plus or minus laminoplasty to restore her alignment secondary to kyphosis.  A cervical myelopathy handout provided.  We will follow up with the patient in 4 months for myelopathic check or sooner if needed.    I saw the patient in conjunction with the cosigning physician.        Silva Bandy, PA-C    Felicity E. Thana Ates, MD                DD:  03/30/2023 10:36:36  DT:  03/31/2023 07:08:30 TAW  D#:  [id number]

## 2023-04-01 ENCOUNTER — Ambulatory Visit: Payer: Self-pay

## 2023-04-01 ENCOUNTER — Other Ambulatory Visit: Payer: Self-pay

## 2023-04-01 ENCOUNTER — Encounter (INDEPENDENT_AMBULATORY_CARE_PROVIDER_SITE_OTHER): Payer: Self-pay

## 2023-04-01 VITALS — BP 131/81 | HR 95 | Temp 98.2°F | Resp 20 | Ht 62.0 in | Wt 161.2 lb

## 2023-04-01 DIAGNOSIS — M545 Low back pain, unspecified: Secondary | ICD-10-CM | POA: Insufficient documentation

## 2023-04-01 DIAGNOSIS — M5417 Radiculopathy, lumbosacral region: Secondary | ICD-10-CM | POA: Insufficient documentation

## 2023-04-01 DIAGNOSIS — M5126 Other intervertebral disc displacement, lumbar region: Secondary | ICD-10-CM | POA: Insufficient documentation

## 2023-04-01 DIAGNOSIS — M47816 Spondylosis without myelopathy or radiculopathy, lumbar region: Secondary | ICD-10-CM | POA: Insufficient documentation

## 2023-04-01 NOTE — Progress Notes (Signed)
 [facility]  [address], [address]  [place], [place] [address]  [phone]    Progress Note    April Cordova  MRN: [id number]  DOB: March 02, 1965  Date of Service: 04/01/2023    CHIEF COMPLAINT  Chief Complaint   Patient presents with    Low Back Pain       SUBJECTIVE  April Cordova is a 58 y.o. female who presents to clinic for procedure follow up. Patient had left L5 TFESI performed on 03/18/23. Patient endorses 20% relief still lasting . Denies any side effects or complication with the procedure. Really did not see much relief with this last injection of her symptoms. She has separate back pain with numbness and tingling in her left leg. She states she can deal with the symptoms in her leg but her back pain is what interferes with her daily life the most. She does do yoga but physical therapist didn't want her to do downward dog and cobra positions in yoga which is a majority of the practice. She was receiving benefit from physical therapy however she is currently not attending due to her insurance not paying for sessions anymore. Is wondering what her options are now. She previously took gabapentin for the pain however she started to become very forgetful and had a significant amount of brain fog. Was taking ibuprofen constantly but then started to worry about her liver so decreased her use. Denies any current GERD symptoms. This would help with her pain when she took it.     TREATMENT HISTORY:      Helpful Not Helpful Not Tried  Comments   MBB       RFA/Cryoablation       Epidural       Transforaminal/  Nerve root block  X X  03/18/23 left L5 20% still lasting   SI injection       Intraarticular Joint        Sympathetic Block       Other        TPI       GTB        SCS       Pain pump         Physical Therapy or HEP  X   Recent PT: current back HEP   Massage Therapy       Acupuncture        Chiropractor       Exercise Physiologist        Counseling/CBT         Medications:    cyclobenzaprine (FLEXERIL) 5 mg Oral Tablet, Take 1 Tablet (5 mg total) by mouth Every night as needed for Muscle spasms  estradioL (CLIMARA) 0.05 mg/24 hr Transdermal Patch Weekly, Place 1 Patch (0.05 mg total) on the skin Every 7 days  gabapentin (NEURONTIN) 300 mg Oral Capsule, Take 1 Capsule (300 mg total) by mouth Three times a day (Patient not taking: Reported on 03/18/2023)  HYDROcodone-acetaminophen (NORCO) 5-325 mg Oral Tablet, Take 1 Tablet by mouth Every 4 hours as needed for Pain (Patient not taking: Reported on 04/01/2023)  medroxyPROGESTERone (PROVERA) 10 mg Oral Tablet, Take 1 Tablet (10 mg total) by mouth Every other day  gabapentin (NEURONTIN) 100 mg Oral Capsule, Take 1 cap TID with current 300 mg TID to equal 400 mg TID (Patient not taking: Reported on 03/18/2023)  venlafaxine (EFFEXOR XR) 37.5 mg Oral Capsule, Sust. Release 24 hr, Take 1 Capsule (37.5 mg total)  by mouth Once a day for 90 days    No facility-administered medications prior to visit.      Allergies:   No Known Allergies    Review of Systems:   CONSTITUTIONAL: Denies weight change. Denies fever.   GI: Denies bowel incontinence.  GU: Denies urinary incontinence or retention.  NEUROLOGICAL: Denies paralysis. Denies tremors    PSYCHIATRIC: Denies acute changes in mood.  MUSCULOSKELETAL: See HPI.    OBJECTIVE  BP 131/81   Pulse 95   Temp 36.8 C (98.2 F)   Resp 20   Ht 1.575 m (5\' 2" )   Wt 73.1 kg (161 lb 3.2 oz)   LMP 12/20/2022   SpO2 98%   BMI 29.48 kg/m           General: no distress   Abdomen: soft, non-tender and non-distended   Respiratory: No increased work of breathing. No increased accessory muscle use   Cardiovascular: acyanotic, no JVD  Skin: warm and dry, no rash  Neurologic: gait is normal , motor 5/5 and sensory intact in b/l UEs, AOx3, CN 2-12 grossly intact, negative Hoffman's b/l  Deep Tendon Reflexes    Brachioradialis Bicep Patellar Achilles   Right  2+ 2+ 2+ 2+   Left 2+ 2+ 2+ 2+     Psychiatric: normal  affect and behavior  Musculoskeletal: no cyanosis or edema  Lumbar   ROM: Preserved   Palpation: Tender   Motor: 5/5 in LEs b/l   Sensory: Intact in LEs b/l   Facet loading: Positive bilaterally    Facet TTP: Positive bilaterally    Trigger points:  Negative   Straight leg raise:  Negative bilaterally      SI Joint   Palpation: Tender left, non-tender right   Fortin Finger Sign: Negative bilaterally    Provocation Test :         FABER: Negative bilaterally      Nursing Notes:   Princess Perna, [place]  04/01/23 1457  Signed  Procedure Follow Up  Ronnald Nian  [id number]  Chief Complaint   Patient presents with    Low Back Pain       [place] Pain Rating Scale     On a scale of 0-10, during the past 24 hours, pain has interfered with you usual activity: 4     On a scale of 0-10, during the past 24 hours, pain has interfered with your sleep: 2    On a scale of 0-10, during the past 24 hours, pain has affected your mood: 2     On a scale of 0-10, during the past 24 hours, pain has contributed to your stress: 1     On a scale of 0-10, what is your overall pain Rating: 3      Vitals:    04/01/23 1454   BP: 131/81   Pulse: 95   Resp: 20   Temp: 36.8 C (98.2 F)   SpO2: 98%   Weight: 73.1 kg (161 lb 3.2 oz)   Height: 1.575 m (5\' 2" )   PainSc:   3   PainLoc: Back   LMP: 12/20/2022     Body mass index is 29.48 kg/m.    New imaging:    Patient is here today S/ P left L5 SNRB that was done on 03/18/23 and reports 80 % relief that IS STILL LASTING.    Princess Perna, [place]  04/01/2023, 14:56       Imaging: Reviewed    CT  LUMBAR SPINE WO IV CONTRAST performed on 02/01/2023 11:14 AM.     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     There is severe  degenerative disease at L5-S1. There is moderate left and severe right neural foraminal stenosis.     At L4-5, facet osteoarthritis, ligamentum flavum hypertrophy and a broad-based disc bulge produces moderate left and mild right neural foraminal stenosis and mild-to-moderate spinal stenosis.     At L3-4, there is no spinal stenosis. There is a broad-based disc bulge which produces moderate bilateral neural foraminal stenosis.     At L2-3, a broad-based disc bulge produces moderate bilateral neural foraminal stenosis. There is mild spinal stenosis.     At L1-2, there is no abnormality.     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 Lumbosacral radiculopathy at L5  M54.17       3. Lumbar spondylosis  M47.816       4. Low back pain  M54.50           Due to her low back pain interfering with her ADLs the most, suggest MBB and made her aware this would do nothing for her leg pain. She wants to think about this option. Discussed other options which include medications and continued conservative treatments. She would like to try OTC NSAIDs and acetaminophen first before trying prescription options.     PLAN/RECOMMENDATION  No orders of the defined types were placed in this encounter.      1. Discussed MBB's and RFA but would like to hold off for now    Handouts provided today   2. Will try ibuprofen and acetaminophen combination    Is aware if she develops GERD symptoms to take OTC acid reducer    If wanting to start prescription NSAID, would send in Voltaren and then see her a month after starting it   3. Advised to try to resume yoga while not in PT to help with conditioning and to follow her pain   4. Follow up PRN     Our impression, treatment recommendations and plan from today's visit were reviewed in detail with the patient in the office. All of the patient's questions were answered. If a procedure was ordered, it was explained using a  spine model,  including technique, benefits, alternatives and the associated risks.   The patient verbalized understanding of the above plan and the patient wishes to move forward with the above noted plan.    Sabino Gasser, PA-C 04/01/2023, 16:07

## 2023-04-01 NOTE — Nursing Note (Signed)
 Procedure Follow Up  April Cordova  [id number]  Chief Complaint   Patient presents with    Low Back Pain       [place] Pain Rating Scale     On a scale of 0-10, during the past 24 hours, pain has interfered with you usual activity: 4     On a scale of 0-10, during the past 24 hours, pain has interfered with your sleep: 2    On a scale of 0-10, during the past 24 hours, pain has affected your mood: 2     On a scale of 0-10, during the past 24 hours, pain has contributed to your stress: 1     On a scale of 0-10, what is your overall pain Rating: 3      Vitals:    04/01/23 1454   BP: 131/81   Pulse: 95   Resp: 20   Temp: 36.8 C (98.2 F)   SpO2: 98%   Weight: 73.1 kg (161 lb 3.2 oz)   Height: 1.575 m (5\' 2" )   PainSc:   3   PainLoc: Back   LMP: 12/20/2022     Body mass index is 29.48 kg/m.    New imaging:    Patient is here today S/ P left L5 SNRB that was done on 03/18/23 and reports 80 % relief that IS STILL LASTING.    Princess Perna, [place]  04/01/2023, 14:56

## 2023-04-05 ENCOUNTER — Encounter (HOSPITAL_BASED_OUTPATIENT_CLINIC_OR_DEPARTMENT_OTHER): Payer: Self-pay | Admitting: Obstetrics & Gynecology

## 2023-04-06 ENCOUNTER — Other Ambulatory Visit (HOSPITAL_BASED_OUTPATIENT_CLINIC_OR_DEPARTMENT_OTHER): Payer: Self-pay | Admitting: Obstetrics & Gynecology

## 2023-04-06 MED ORDER — ESTRADIOL 0.5 MG TABLET
0.5000 mg | ORAL_TABLET | Freq: Every day | ORAL | 11 refills | Status: DC
Start: 2023-04-06 — End: 2023-11-09

## 2023-05-03 ENCOUNTER — Other Ambulatory Visit (HOSPITAL_BASED_OUTPATIENT_CLINIC_OR_DEPARTMENT_OTHER): Payer: Self-pay | Admitting: Obstetrics & Gynecology

## 2023-05-07 ENCOUNTER — Encounter (HOSPITAL_BASED_OUTPATIENT_CLINIC_OR_DEPARTMENT_OTHER): Payer: Self-pay | Admitting: Obstetrics & Gynecology

## 2023-05-07 ENCOUNTER — Other Ambulatory Visit (HOSPITAL_BASED_OUTPATIENT_CLINIC_OR_DEPARTMENT_OTHER): Payer: Self-pay | Admitting: Obstetrics & Gynecology

## 2023-06-22 ENCOUNTER — Other Ambulatory Visit (INDEPENDENT_AMBULATORY_CARE_PROVIDER_SITE_OTHER): Payer: Self-pay | Admitting: ANESTHESIOLOGY

## 2023-06-22 ENCOUNTER — Encounter (INDEPENDENT_AMBULATORY_CARE_PROVIDER_SITE_OTHER): Payer: Self-pay | Admitting: ANESTHESIOLOGY

## 2023-06-22 DIAGNOSIS — M5416 Radiculopathy, lumbar region: Secondary | ICD-10-CM

## 2023-06-22 DIAGNOSIS — M545 Low back pain, unspecified: Secondary | ICD-10-CM

## 2023-07-19 ENCOUNTER — Encounter (INDEPENDENT_AMBULATORY_CARE_PROVIDER_SITE_OTHER): Payer: Self-pay

## 2023-07-19 ENCOUNTER — Telehealth (INDEPENDENT_AMBULATORY_CARE_PROVIDER_SITE_OTHER): Payer: Self-pay | Admitting: ANESTHESIOLOGY

## 2023-07-19 NOTE — Telephone Encounter (Signed)
 CM left voice message requesting a return phone call to review guidelines. A MyChart message was sent to PT about Guidelines.    07/19/2023  Toribio Molt, CASE MANAGER

## 2023-07-20 ENCOUNTER — Ambulatory Visit (INDEPENDENT_AMBULATORY_CARE_PROVIDER_SITE_OTHER): Payer: Self-pay | Admitting: ANESTHESIOLOGY

## 2023-07-29 ENCOUNTER — Ambulatory Visit (INDEPENDENT_AMBULATORY_CARE_PROVIDER_SITE_OTHER): Payer: Self-pay | Admitting: ANESTHESIOLOGY

## 2023-07-29 ENCOUNTER — Encounter (INDEPENDENT_AMBULATORY_CARE_PROVIDER_SITE_OTHER): Payer: Self-pay

## 2023-07-29 ENCOUNTER — Ambulatory Visit: Payer: Self-pay

## 2023-07-29 ENCOUNTER — Other Ambulatory Visit: Payer: Self-pay

## 2023-07-29 DIAGNOSIS — M4802 Spinal stenosis, cervical region: Secondary | ICD-10-CM | POA: Insufficient documentation

## 2023-07-29 DIAGNOSIS — R292 Abnormal reflex: Secondary | ICD-10-CM | POA: Insufficient documentation

## 2023-07-29 DIAGNOSIS — G992 Myelopathy in diseases classified elsewhere: Secondary | ICD-10-CM | POA: Insufficient documentation

## 2023-07-29 DIAGNOSIS — M48061 Spinal stenosis, lumbar region without neurogenic claudication: Secondary | ICD-10-CM | POA: Insufficient documentation

## 2023-07-29 DIAGNOSIS — M79605 Pain in left leg: Secondary | ICD-10-CM | POA: Insufficient documentation

## 2023-07-29 DIAGNOSIS — M5416 Radiculopathy, lumbar region: Secondary | ICD-10-CM | POA: Insufficient documentation

## 2023-07-29 NOTE — Progress Notes (Signed)
 [place]  [facility]                              [facility]  [address]  [place] [address]  [phone]              Date of Service: 07/29/2023  Name: Lexani Corona  Date of Birth: 12-08-1965    MRI Results and Low Back Pain    SUBJECTIVE: Scotland Dost is a 58 y.o. woman who presents for a follow up of left L5 radiculopathy and cervical stenosis with myelopathy. She states that from a low back and left leg standpoint, she is doing well. She has intermittent cramping to her low back but it's not bothersome. She feels that her left foot strength has improved. She reports occasional numbness to the lateral thigh, great toe, and medial plantar foot but feels that it's improved. She does daily walking and feels that it helps.     In regards to her myelopathic symptoms, she denies any worsening symptoms. She's still dropping items and has some weakness to her grip. She denies any balance issues or dexterity issues. She does note that intermittently she will have some leaking of urine after urination. She denies any urgency or incontinence otherwise. She reports some intermittent numbness of fingers on the palmar side only when waking. It seems to respond to repositioning. She thinks it's all fingers, but will pay attention to this.     PAST MEDICAL HISTORY:   Past Medical History:   Diagnosis Date    ADD (attention deficit disorder)     Anxiety     Cancer (CMS HCC)     basal cell ca   head and shoulders    Depression          Allergies[1]    PHYSICAL EXAMINATION:   General Assessment: Patient is alert and oriented and in no signs of distress.  Respiratory: No increased work of breathing or plainly audible adventitious sounds.  Cardiovascular:  2+ Peripheral pulses.   Skin: clear/intact  Neurologic: alert and oriented x3  Musculoskeletal:   Gait and Station:normal gait and station  Straight Leg Raise: Left: negative Right:negative  Crossed Straight Leg Raise: negative   Facet loading:  negative   Musculoskeletal Tenderness: negative  Extremities: no cyanosis, clubbing or edema present    Sensation:   Sensation to light touch intact throughout all extremities.    Manual Muscle Grading:   D B WE T FF HI HF Q TA EHL G   RIGHT 5/5  5/5  5/5  5/5  5/5  5/5  5/5  5/5  5/5  5/5  5/5    LEFT 5/5  5/5  5/5  5/5  5/5  5/5  5/5  5/5  5/5  5/5  5/5      Reflexes:   RIGHT LEFT   BICEPS 2+ 2+   BRACHIORADIALIS 2+ 2+   TRICEPS 2+ 2+   PATELLAR 2+ 2+   ACHILLES 2+ 2+     Hoffman's Reflex: Left: can appreciate a possible slight Hoffman's Right: negative  Clonus: Left: negative Right: negative      IMAGING  Independent review of imaging on 07/29/2023   No new imaging obtained today    ASSESSMENT AND PLAN   Patient is a 58 y.o. female with a left L5 radiculopathy that has improved and a 4 month myelopathy check. She denies any progression of symptoms.  We reviewed symptoms in depth today. She will monitor the hand numbness, leaking of urine after urination, and other symptoms. She will call with any concerns or worsening of symptoms. Otherwise we will see her back in 6 months. She will call with any worsening leg pain. She would like to renew PT for her back. A script was given today.     I have answered all questions and the patient is pleased with the plan of treatment. If the patient has any questions or concerns, the patient can contact us .   I have seen this patient independently and reviewed with the cosigning physician  The date and time stamp below reflect the action of opening and starting the note and do not reflect the visit start time, end time or visit length.    Brittney Stimac, APRN,NP-C 07/29/2023    Case and imaging reviewed and discussed with APP listed above. Patient seen independently by APP.     Kitt Minardi E. Elza, MD       [1] No Known Allergies

## 2023-08-06 ENCOUNTER — Encounter (INDEPENDENT_AMBULATORY_CARE_PROVIDER_SITE_OTHER): Payer: Self-pay

## 2023-08-24 ENCOUNTER — Other Ambulatory Visit: Payer: Self-pay

## 2023-08-25 ENCOUNTER — Encounter (INDEPENDENT_AMBULATORY_CARE_PROVIDER_SITE_OTHER): Payer: Self-pay

## 2023-08-25 ENCOUNTER — Other Ambulatory Visit

## 2023-08-25 ENCOUNTER — Other Ambulatory Visit: Payer: Self-pay

## 2023-08-25 ENCOUNTER — Ambulatory Visit (INDEPENDENT_AMBULATORY_CARE_PROVIDER_SITE_OTHER)

## 2023-08-25 VITALS — BP 114/68 | HR 75 | Temp 97.2°F | Ht 62.5 in | Wt 159.0 lb

## 2023-08-25 DIAGNOSIS — M25552 Pain in left hip: Secondary | ICD-10-CM

## 2023-08-25 DIAGNOSIS — Z0000 Encounter for general adult medical examination without abnormal findings: Secondary | ICD-10-CM | POA: Insufficient documentation

## 2023-08-25 DIAGNOSIS — Z114 Encounter for screening for human immunodeficiency virus [HIV]: Secondary | ICD-10-CM | POA: Insufficient documentation

## 2023-08-25 DIAGNOSIS — Z1159 Encounter for screening for other viral diseases: Secondary | ICD-10-CM | POA: Insufficient documentation

## 2023-08-25 DIAGNOSIS — M25562 Pain in left knee: Secondary | ICD-10-CM

## 2023-08-25 DIAGNOSIS — G8929 Other chronic pain: Secondary | ICD-10-CM

## 2023-08-25 DIAGNOSIS — M25551 Pain in right hip: Secondary | ICD-10-CM | POA: Insufficient documentation

## 2023-08-25 DIAGNOSIS — Z23 Encounter for immunization: Secondary | ICD-10-CM

## 2023-08-25 DIAGNOSIS — M5126 Other intervertebral disc displacement, lumbar region: Secondary | ICD-10-CM | POA: Insufficient documentation

## 2023-08-25 DIAGNOSIS — M25561 Pain in right knee: Secondary | ICD-10-CM | POA: Insufficient documentation

## 2023-08-25 DIAGNOSIS — F988 Other specified behavioral and emotional disorders with onset usually occurring in childhood and adolescence: Secondary | ICD-10-CM

## 2023-08-25 LAB — CBC WITH DIFF
BASOPHIL #: <0.1 x10ˆ3/uL (ref <=0.20)
BASOPHIL %: 0.8 %
EOSINOPHIL #: <0.1 x10ˆ3/uL (ref <=0.50)
EOSINOPHIL %: 1.6 %
HCT: 41.9 % (ref 34.8–46.0)
HGB: 14.3 g/dL (ref 11.5–16.0)
IMMATURE GRANULOCYTE #: <0.1 x10ˆ3/uL (ref <0.10)
IMMATURE GRANULOCYTE %: 0.2 % (ref 0.0–1.0)
LYMPHOCYTE #: 1.62 x10ˆ3/uL (ref 1.00–4.80)
LYMPHOCYTE %: 32.1 %
MCH: 31.6 pg (ref 26.0–32.0)
MCHC: 34.1 g/dL (ref 31.0–35.5)
MCV: 92.7 fL (ref 78.0–100.0)
MONOCYTE #: 0.48 x10ˆ3/uL (ref 0.20–1.10)
MONOCYTE %: 9.5 %
MPV: 9.3 fL (ref 8.7–12.5)
NEUTROPHIL #: 2.81 x10ˆ3/uL (ref 1.50–7.70)
NEUTROPHIL %: 55.8 %
PLATELETS: 282 x10ˆ3/uL (ref 150–400)
RBC: 4.52 x10ˆ6/uL (ref 3.85–5.22)
RDW-CV: 12.3 % (ref 11.5–15.5)
WBC: 5 x10ˆ3/uL (ref 3.7–11.0)

## 2023-08-25 LAB — LIPID PANEL
CHOL/HDL RATIO: 5
CHOLESTEROL: 196 mg/dL (ref 100–200)
HDL CHOL: 39 mg/dL — ABNORMAL LOW (ref >=50)
LDL CALC: 132 mg/dL — ABNORMAL HIGH (ref <100)
NON-HDL: 157 mg/dL (ref <=190)
TRIGLYCERIDES: 141 mg/dL (ref <150)
VLDL CALC: 25 mg/dL (ref <30)

## 2023-08-25 LAB — COMPREHENSIVE METABOLIC PNL, FASTING
ALBUMIN: 4 g/dL (ref 3.5–5.0)
ALKALINE PHOSPHATASE: 51 U/L (ref 50–130)
ALT (SGPT): 38 U/L — ABNORMAL HIGH (ref <31)
ANION GAP: 7 mmol/L (ref 4–13)
AST (SGOT): 29 U/L (ref 11–34)
BILIRUBIN TOTAL: 0.6 mg/dL (ref 0.3–1.3)
BUN/CREA RATIO: 20 (ref 6–22)
BUN: 13 mg/dL (ref 8–25)
CALCIUM: 8.8 mg/dL (ref 8.6–10.2)
CHLORIDE: 109 mmol/L (ref 96–111)
CO2 TOTAL: 21 mmol/L — ABNORMAL LOW (ref 22–30)
CREATININE: 0.65 mg/dL (ref 0.60–1.05)
ESTIMATED GFR - FEMALE: >90 mL/min/BSA (ref >=60)
GLUCOSE: 88 mg/dL (ref 70–99)
POTASSIUM: 4.4 mmol/L (ref 3.5–5.1)
PROTEIN TOTAL: 7.1 g/dL (ref 6.0–7.9)
SODIUM: 137 mmol/L (ref 136–145)

## 2023-08-25 LAB — THYROID STIMULATING HORMONE WITH FREE T4 REFLEX: TSH: 0.215 u[IU]/mL — ABNORMAL LOW (ref 0.350–4.940)

## 2023-08-25 LAB — HEPATITIS C ANTIBODY SCREEN WITH REFLEX TO HCV PCR: HCV ANTIBODY QUALITATIVE: NEGATIVE

## 2023-08-25 LAB — THYROXINE, FREE (FREE T4): THYROXINE (T4), FREE: 1.08 ng/dL (ref 0.70–1.48)

## 2023-08-25 LAB — HIV1/HIV2 SCREEN, COMBINED ANTIGEN AND ANTIBODY: HIV SCREEN, COMBINED ANTIGEN & ANTIBODY: NEGATIVE

## 2023-08-25 MED ORDER — BUPROPION HCL XL 150 MG 24 HR TABLET, EXTENDED RELEASE
150.0000 mg | ORAL_TABLET | Freq: Every day | ORAL | 1 refills | Status: DC
Start: 2023-08-25 — End: 2023-10-21

## 2023-08-25 NOTE — Progress Notes (Signed)
 OUTPATIENT PROGRESS NOTE    Subjective:   Patient ID:  April Cordova is a pleasant 58 y.o. female.    Chief Complaint: Physical (No pap), Menopause, and Blood Work      History of Present Illness:  LMP 4 months ago---recent HNP lumbar  Started HRT  March-seeing gyne    Recent stenosis  Cspine too and DDD seen--she was worse on Wellbutrin  and Stimulant  She is seeing Gyne for her hormone replacement therapy asked my opinion on creams.  She would like to have a Lyme test because of some arthralgias she has been experiencing.  She was born and so quickly and does not moving to [place] he is back here now has been remarried.  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  She has a history of attention deficit was on Ritalin  and Wellbutrin  but had a lot of increased neck spasms requiring some Botox injection into her facial area she stopped the medication it was better she has been to try something again because she is having attention she feels a lot of her problems were from her neck  The history is provided by the patient.      Allergies:   Allergies[1]      Medications:   cyclobenzaprine  (FLEXERIL ) 5 mg Oral Tablet, Take 1 Tablet (5 mg total) by mouth Every night as needed for Muscle spasms  estradioL  (ESTRACE ) 0.5 mg Oral Tablet, Take 1 Tablet (0.5 mg total) by mouth Daily  medroxyPROGESTERone  (PROVERA ) 10 mg Oral Tablet, Take 1 Tablet (10 mg total) by mouth Every other day  gabapentin  (NEURONTIN ) 300 mg Oral Capsule, Take 1 Capsule (300 mg total) by mouth Three times a day (Patient not taking: Reported on 07/29/2023)  HYDROcodone-acetaminophen (NORCO) 5-325 mg Oral Tablet, Take 1 Tablet by mouth Every 4 hours as needed for Pain (Patient not taking: Reported on 07/29/2023)    No facility-administered medications prior to visit.        Immunization History:     Immunization History   Administered Date(s) Administered    Covid-19 Vaccine,Pfizer-BioNTech,Purple Top,74yrs+  05/31/2019, 06/21/2019    Influenza Vaccine, 6 month-adult 10/23/2015, 12/06/2019, 03/07/2021, 12/09/2022    Shingrix  - Zoster Vaccine 01/31/2021, 04/01/2021         Past Medical History:     Past Medical History:   Diagnosis Date    ADD (attention deficit disorder)     Anxiety     Cancer (CMS HCC)     basal cell ca   head and shoulders    Depression     Lumbar herniated disc              Past Surgical History:     Past Surgical History Pertinent Negatives:   Procedure Date Noted    HX APPENDECTOMY 07/09/2021    HX BLADDER REPAIR 07/09/2021    HX BLOOD TRANSFUSION 07/09/2021    HX BREAST BIOPSY 01/23/2019    HX BREAST LUMPECTOMY 01/23/2019    HX BREAST RECONSTRUCTION 01/23/2019    HX BREAST REDUCTION 01/23/2019    HX CESAREAN SECTION 07/09/2021    HX CHOLECYSTECTOMY 07/09/2021    HX CONE BIOPSY 07/09/2021    HX CYST INCISION AND DRAINAGE 01/23/2019    HX CYSTOSCOPY 07/09/2021    HX HYSTERECTOMY 03/10/2021    HX LEEP PROCEDURE 07/09/2021    HX MASTECTOMY, SIMPLE 03/10/2021    HX OOPHORECTOMY 01/23/2019    HX TONSILLECTOMY 07/09/2021  HX TUBAL LIGATION 07/09/2021    PB POST COLPORRHAPHY,RECTUM/VAGINA 07/09/2021    PB REPAIR ENTEROCELE,VAG APPRCH 07/09/2021    PB REPR VAGINAL PROLAPSE,SACROSP LIG 07/09/2021    PB SLING OPER STRES INCONTINENCE 07/09/2021    URETERAL STENT TO GRAVITY DRAINAGE 07/09/2021             Family History:     Family Medical History:       Problem Relation (Age of Onset)    Alcohol abuse Brother    Alzheimer's/Dementia Mother    Breast Cancer Sister (27)    Cancer Father    Diabetes Mother    Drug Abuse Brother                Social History:   April Cordova  reports that she has quit smoking. Her smoking use included cigarettes. She has a 5 pack-year smoking history. She has never used smokeless tobacco. She reports current alcohol use of about 4 glasses of wine, 0 cans of beer, 0 shots of liquor, and 0 standard drinks or equivalent per week. She reports being sexually active and has had  partner(s) who are female. She reports using the following method of birth control/protection: Coitus Interruptus. She reports that she does not use drugs.        Review of Systems: In addition to HPI  Review of Systems   Constitutional: Negative.    HENT: Negative.     Respiratory: Negative.     Cardiovascular: Negative.    Gastrointestinal: Negative.    Genitourinary: Negative.    Musculoskeletal:  Positive for back pain and neck pain.   Skin: Negative.    Neurological: Negative.    Psychiatric/Behavioral: Negative.           Objective:   Vitals:    Vitals:    08/25/23 0839   BP: 114/68   Pulse: 75   Temp: 36.2 C (97.2 F)   SpO2: 97%   Weight: 72.1 kg (158 lb 15.2 oz)   Height: 1.588 m (5' 2.5)   BMI: 28.61         Wt Readings from Last 3 Encounters:   08/25/23 72.1 kg (158 lb 15.2 oz)   04/01/23 73.1 kg (161 lb 3.2 oz)   03/25/23 71.9 kg (158 lb 8.2 oz)      Body mass index is 28.61 kg/m.    Physical Exam  Constitutional:       Appearance: Normal appearance.   HENT:      Right Ear: Tympanic membrane normal.      Left Ear: Tympanic membrane normal.      Nose: Nose normal.      Mouth/Throat:      Mouth: Mucous membranes are moist.   Cardiovascular:      Rate and Rhythm: Normal rate and regular rhythm.      Heart sounds: Normal heart sounds.   Pulmonary:      Breath sounds: Normal breath sounds.   Abdominal:      Palpations: Abdomen is soft.   Musculoskeletal:         General: Normal range of motion.      Cervical back: Normal range of motion. Tenderness present.   Skin:     General: Skin is warm and dry.   Neurological:      General: No focal deficit present.      Mental Status: She is alert.   Psychiatric:         Mood and Affect: Mood  normal.         POCT Results:                   I have reviewed and interpreted  the point of care( POCT) results in this note and used this information in my assessment. Interperation: None performed Today  Devere Cockayne, MD  08/25/2023, 08:49                Nursing Notes:   Tivis Morrison, [facility]  08/25/23 9160  Signed     08/25/23 0838   Housing Stability   What is your housing situation? Has Housing   Are you worried about losing your housing? 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  No   Transportation   Has lack of transportation kept you from medical appointments, meetings, work, or from getting things needed for daily living?  No   Social Connections   How often do you see or talk to people that you care about and feel close to? (For example: talking to friends on the phone, visiting friends or family, going to church or club meetings) >=5x a wk   Intimate Partner Violence   In the past year, have you been afraid of your partner or ex-partner? No   Do you feel physically safe and emotionally safe where you currently live? Yes   Help Needed   Would you like help with any of these needs? No   Are any of these needs urgent? No         Assessment & Plan:       1. Annual physical exam (Primary)  Patient is here for her exam she is new to me has been in good health but has had some issues with back and neck pain herniated disc she has been care now for her lower back she is seeing Johnny for her perimenopausal symptoms is on HRT and wonders about switch to possible bioidentical creams her topicals.  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 Attention deficit disorder, unspecified type  History of inattention as noted above has been on Ritalin  and Wellbutrin  worked well for her but she  had increasing neck pain and spasms and stopped both medications.  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 Lumbar disc herniation  Undergone care and injections currently in physical therapy    5. Screening for HIV (human immunodeficiency virus)  Baseline screening  - HIV1/HIV2 SCREEN, COMBINED ANTIGEN AND ANTIBODY; Future    6. Encounter for hepatitis C screening test for low risk patient  Screen ordered  - HEPATITIS C ANTIBODY SCREEN WITH REFLEX TO HCV  PCR; Future         She will see her Gyne discuss HRT labs were ordered today she will follow up here as needed she does see Dermatology also      Health Maintenance   Topic Date Due    Hepatitis C screening  Never done    HIV Screening  Never done    Adult Tdap-Td (1 - Tdap) Never done    Hepatitis B Vaccine (1 of 3 - 19+ 3-dose series) Never done    Pneumococcal Vaccination, Age 67+ (1 of 1 - PCV) Never done    Pap smear/HPV  02/27/2022    Covid-19 Vaccine (3 - 2024-25 season) 09/20/2022    NonMedicare Preventative Exam  07/14/2023    Influenza Vaccine (1) 09/20/2023    Breast Cancer Screening  03/24/2024    Colonoscopy  03/17/2028    Shingles Vaccine  Completed     Return if symptoms worsen or fail to improve.  Status of Covid Vaccination see chart summary  [id number]}  Covid-19 Instructions and Precautions explained  No follow-ups on file.  Devere Cockayne, MD 08/25/2023 08:49  Electronically Signed             [1] No Known Allergies

## 2023-08-25 NOTE — Nursing Note (Signed)
 08/25/23 9161   Housing Stability   What is your housing situation? Has Housing   Are you worried about losing your housing? 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  No   Transportation   Has lack of transportation kept you from medical appointments, meetings, work, or from getting things needed for daily living?  No   Social Connections   How often do you see or talk to people that you care about and feel close to? (For example: talking to friends on the phone, visiting friends or family, going to church or club meetings) >=5x a wk   Intimate Partner Violence   In the past year, have you been afraid of your partner or ex-partner? No   Do you feel physically safe and emotionally safe where you currently live? Yes   Help Needed   Would you like help with any of these needs? No   Are any of these needs urgent? No

## 2023-08-26 ENCOUNTER — Other Ambulatory Visit (INDEPENDENT_AMBULATORY_CARE_PROVIDER_SITE_OTHER): Payer: Self-pay

## 2023-08-26 ENCOUNTER — Ambulatory Visit (INDEPENDENT_AMBULATORY_CARE_PROVIDER_SITE_OTHER): Payer: Self-pay

## 2023-08-26 DIAGNOSIS — R946 Abnormal results of thyroid function studies: Secondary | ICD-10-CM

## 2023-08-26 LAB — LYME ANTIBODY PANEL WITH REFLEX: LYME ANTIBODY TOTAL (Screen): NEGATIVE

## 2023-09-02 ENCOUNTER — Other Ambulatory Visit (INDEPENDENT_AMBULATORY_CARE_PROVIDER_SITE_OTHER): Payer: Self-pay | Admitting: ANESTHESIOLOGY

## 2023-09-02 ENCOUNTER — Encounter (INDEPENDENT_AMBULATORY_CARE_PROVIDER_SITE_OTHER): Payer: Self-pay | Admitting: ANESTHESIOLOGY

## 2023-09-02 DIAGNOSIS — M5116 Intervertebral disc disorders with radiculopathy, lumbar region: Secondary | ICD-10-CM

## 2023-09-02 DIAGNOSIS — M5417 Radiculopathy, lumbosacral region: Secondary | ICD-10-CM

## 2023-09-02 DIAGNOSIS — M5126 Other intervertebral disc displacement, lumbar region: Secondary | ICD-10-CM

## 2023-10-05 ENCOUNTER — Other Ambulatory Visit: Payer: Self-pay

## 2023-10-05 ENCOUNTER — Telehealth (INDEPENDENT_AMBULATORY_CARE_PROVIDER_SITE_OTHER): Payer: Self-pay | Admitting: ANESTHESIOLOGY

## 2023-10-05 ENCOUNTER — Encounter (INDEPENDENT_AMBULATORY_CARE_PROVIDER_SITE_OTHER): Payer: Self-pay

## 2023-10-05 ENCOUNTER — Encounter (INDEPENDENT_AMBULATORY_CARE_PROVIDER_SITE_OTHER): Payer: Self-pay | Admitting: Physician Assistant

## 2023-10-05 ENCOUNTER — Other Ambulatory Visit (INDEPENDENT_AMBULATORY_CARE_PROVIDER_SITE_OTHER): Payer: Self-pay | Admitting: ANESTHESIOLOGY

## 2023-10-05 ENCOUNTER — Other Ambulatory Visit (INDEPENDENT_AMBULATORY_CARE_PROVIDER_SITE_OTHER): Payer: Self-pay | Admitting: Physician Assistant

## 2023-10-05 DIAGNOSIS — M545 Low back pain, unspecified: Secondary | ICD-10-CM

## 2023-10-05 DIAGNOSIS — M5416 Radiculopathy, lumbar region: Secondary | ICD-10-CM

## 2023-10-05 NOTE — Telephone Encounter (Signed)
 CM left voice message requesting a return phone call to review guidelines. A MyChart message was sent to PT about Guidelines.    10/05/2023  Toribio Molt, CASE MANAGER

## 2023-10-06 ENCOUNTER — Other Ambulatory Visit (INDEPENDENT_AMBULATORY_CARE_PROVIDER_SITE_OTHER): Payer: Self-pay | Admitting: Physician Assistant

## 2023-10-06 ENCOUNTER — Ambulatory Visit (INDEPENDENT_AMBULATORY_CARE_PROVIDER_SITE_OTHER): Payer: Self-pay | Admitting: ANESTHESIOLOGY

## 2023-10-06 ENCOUNTER — Encounter (INDEPENDENT_AMBULATORY_CARE_PROVIDER_SITE_OTHER): Payer: Self-pay | Admitting: ANESTHESIOLOGY

## 2023-10-06 ENCOUNTER — Ambulatory Visit: Payer: Self-pay | Attending: ANESTHESIOLOGY | Admitting: ANESTHESIOLOGY

## 2023-10-06 DIAGNOSIS — M5416 Radiculopathy, lumbar region: Secondary | ICD-10-CM | POA: Insufficient documentation

## 2023-10-06 DIAGNOSIS — M545 Low back pain, unspecified: Secondary | ICD-10-CM

## 2023-10-06 MED ORDER — CYCLOBENZAPRINE 5 MG TABLET
5.0000 mg | ORAL_TABLET | Freq: Every evening | ORAL | 0 refills | Status: AC | PRN
Start: 2023-10-06 — End: ?

## 2023-10-06 NOTE — Nursing Note (Signed)
[place] Pain Rating Scale     On a scale of 0-10, during the past 24 hours, pain has interfered with you usual activity: 6     On a scale of 0-10, during the past 24 hours, pain has interfered with your sleep: 2    On a scale of 0-10, during the past 24 hours, pain has affected your mood: 5     On a scale of 0-10, during the past 24 hours, pain has contributed to your stress: 5     On a scale of 0-10, what is your overall pain Rating: 4

## 2023-10-06 NOTE — Patient Instructions (Signed)
 [facility]  [address]  [place] [address]  Dept: [phone]  Dept Fax: [phone]  [phone]                                                 SPECIAL PROCEDURES                                     DISCHARGE FORM                                          [phone]      Please follow the instructions listed below for your procedures.  If you have questions concerning your procedure, you may call and leave a message.  Messages will be returned by the end of the next business day.  If you have an emergency, proceed to your local Emergency Department.      PROCEDURE: LEFT TRANSFORAMINAL EPIDURAL STEROID INJECTION     Do not drive a car or operate machinery until tomorrow.  Rest today and return to normal activities tomorrow.  If you are on restricted activities by your physician, please continue to follow these.  If you are not sure, contact your physician.  It is possible to experience mild numbness of the lower back and legs.  This is temporary.  If you have soreness at the injection site, the application of heat or ice may be helpful. Mild analgesics may also be used.  In case of severe headache; lie flat to decrease it.  Increase all fluids especially those with caffeine.  Mild analgesics are also appropriate.  If headache is not relieved by these measures, contact the Pain Clinic.  Steroid injections may cause temporary increase of blood sugar levels.    These instructions have been reviewed with the patient and appropriate questions have answers.  Zane Music, Ambulatory Care Assistant 10/06/2023 08:53

## 2023-10-06 NOTE — Procedures (Signed)
 [facility]  [address]  [facility] [place] [address]  Operated by [facility]  Procedure Note    Name: April Cordova MRN:  [id number]   Date: 10/06/2023 DOB:  13-Sep-1965 (58 y.o.)         INJ ANESTHETIC/STEROID-TRANSFORAMINAL EPIDURAL,LUMBAR/SACRAL,W FLUORO,UP TO 3 LEVELS    Performed by: Woodie Katz, MD  Authorized by: Woodie Katz, MD    Immediately before the procedure, a time out was called:  Yes  Patient verified:  Yes  Procedure verified:  Yes  Site verified:  Yes  Number of levels::  1    Lumbar Transforaminal Epidural Steroid Injection with Fluoro    Pre Procedure Dx:  lumbar radiculopathy    Post Procedure Dx:  lumbar radiculopathy    Guidance:  Fluoroscopy    Fellow/ Resident: Synthia Cope MD     Level: L5    Side: Left    Skin: 3cc 1% lidocaine    Contrast: 3cc M200 isovue    Injectate: 10 mg dexamethasone , 2cc 0.25% bupivacaine    Consent and timeout were done.  Patient was placed in the prone position.  Skin was prepped and draped in the routine fashion and sterile technique was maintained throughout.    In 30 degrees of ipsilateral oblique the fluoroscope was used to identify the neuroforamen  at: L5                   Overlying skin was anesthetized.  Then, a 22 gauge spinal needle was advanced until trajectory was established toward the superior foramen.  Next, a lateral view was obtained and the needle was advanced until it reached the superior/anterior margin of the foramen.  After negative aspiration, contrast was injected under live fluoroscopy in both AP and lateral views.  Characteristic epidural spread was noted.  No vascular spread was noted.  After repeat negative aspiration the above solution was injected.  Patient tolerated this very well.  Patient returned to the recovery area with no complaints or apparent complications.    Katz Woodie, MD

## 2023-10-21 ENCOUNTER — Other Ambulatory Visit (INDEPENDENT_AMBULATORY_CARE_PROVIDER_SITE_OTHER): Payer: Self-pay

## 2023-10-21 NOTE — Telephone Encounter (Signed)
 Medication refill request:    Last scheduled appointment: 08/25/2023  Currently scheduled future appointment: Visit date not found  Last office visit in this department: 08/25/2023  Last labs:COMPLETE BLOOD COUNT   Lab Results   Component Value Date    WBC 5.0 08/25/2023    HGB 14.3 08/25/2023    HCT 41.9 08/25/2023    PLTCNT 282 08/25/2023       DIFFERENTIAL  Lab Results   Component Value Date    PMNS 55.8 08/25/2023    MONOCYTES 9.5 08/25/2023    BASOPHILS 0.8 08/25/2023    BASOPHILS <0.10 08/25/2023    PMNABS 2.81 08/25/2023    LYMPHSABS 1.62 08/25/2023    EOSABS <0.10 08/25/2023    MONOSABS 0.48 08/25/2023     ,   Comprehensive Metabolic Profile    Lab Results   Component Value Date    SODIUM 137 08/25/2023    POTASSIUM 4.4 08/25/2023    CHLORIDE 109 08/25/2023    CO2 21 (L) 08/25/2023    ANIONGAP 7 08/25/2023    BUN 13 08/25/2023    CREATININE 0.65 08/25/2023    ALBUMIN 4.0 08/25/2023    CALCIUM 8.8 08/25/2023    ALKPHOS 51 08/25/2023    ALT 38 (H) 08/25/2023    AST 29 08/25/2023    TOTBILIRUBIN 0.6 08/25/2023    TOTALPROTEIN 7.1 08/25/2023           Pharmacy:   Preferred Pharmacy       [facility] [address] [place] - [id number]    [address] [place] [address]    Phone: [phone] Fax: [phone]    Hours: Not open 24 hours            Waddell Pair, Ambulatory Care Assistant  10/21/2023, 13:14

## 2023-11-09 ENCOUNTER — Other Ambulatory Visit: Payer: Self-pay

## 2023-11-09 ENCOUNTER — Other Ambulatory Visit (HOSPITAL_BASED_OUTPATIENT_CLINIC_OR_DEPARTMENT_OTHER): Payer: Self-pay | Admitting: Obstetrics & Gynecology

## 2023-11-09 NOTE — Telephone Encounter (Signed)
 Pt has appt 11/29/23 and is requesting refill of medication. Please escript. Burnard Mari, LPN

## 2023-11-10 ENCOUNTER — Other Ambulatory Visit (HOSPITAL_BASED_OUTPATIENT_CLINIC_OR_DEPARTMENT_OTHER): Payer: Self-pay | Admitting: Obstetrics & Gynecology

## 2023-11-10 ENCOUNTER — Ambulatory Visit (INDEPENDENT_AMBULATORY_CARE_PROVIDER_SITE_OTHER): Admitting: NURSE PRACTITIONER

## 2023-11-10 ENCOUNTER — Ambulatory Visit (INDEPENDENT_AMBULATORY_CARE_PROVIDER_SITE_OTHER)

## 2023-11-10 ENCOUNTER — Other Ambulatory Visit: Payer: Self-pay

## 2023-11-10 DIAGNOSIS — Z23 Encounter for immunization: Secondary | ICD-10-CM

## 2023-11-10 MED ORDER — ESTRADIOL 0.5 MG TABLET
0.5000 mg | ORAL_TABLET | Freq: Every day | ORAL | 0 refills | Status: DC
Start: 2023-11-10 — End: 2023-11-13

## 2023-11-10 NOTE — Patient Instructions (Signed)
 Vaccine Information Statement    Influenza (Flu) Vaccine (Inactivated or Recombinant): What You Need to Know    Many vaccine information statements are available in Spanish and other languages. See PromoAge.com.br.  Hojas de informacin sobre vacunas estn disponibles en espaol y en muchos otros idiomas. Visite PromoAge.com.br.    1. Why get vaccinated?    Influenza vaccine can prevent influenza (flu).    Flu is a contagious disease that spreads around the [place]  every year, usually between October and May. Anyone can get the flu, but it is more dangerous for some people. Infants and young children, people 10 years and older, pregnant women, and people with certain health conditions or a weakened immune system are at greatest risk of flu complications.    Pneumonia, bronchitis, sinus infections, and ear infections are examples of flu-related complications. If you have a medical condition, such as heart disease, cancer, or diabetes, flu can make it worse.    Flu can cause fever and chills, sore throat, muscle aches, fatigue, cough, headache, and runny or stuffy nose. Some people may have vomiting and diarrhea, though this is more common in children than adults.     In an average year, thousands of people in the [place]  die from flu, and many more are hospitalized. Flu vaccine prevents millions of illnesses and flu-related visits to the doctor each year.    2. Influenza vaccines     CDC recommends everyone 6 months and older get vaccinated every flu season. Children 6 months through 58 years of age may need 2 doses during a single flu season. Everyone else needs only 1 dose each flu season.    It takes about 2 weeks for protection to develop after vaccination.    There are many flu viruses, and they are always changing. Each year a new flu vaccine is made to protect against the influenza viruses believed to be likely to cause disease in the upcoming flu season. Even when the vaccine doesn't  exactly match these viruses, it may still provide some protection.     Influenza vaccine does not cause flu.    Influenza vaccine may be given at the same time as other vaccines.    3. Talk with your health care provider    Tell your vaccination provider if the person getting the vaccine:   Has had an allergic reaction after a previous dose of influenza vaccine, or has any severe, life-threatening allergies    Has ever had Guillain-Barr Syndrome (also called "GBS")    In some cases, your health care provider may decide to postpone influenza vaccination until a future visit.    Influenza vaccine can be administered at any time during pregnancy. Women who are or will be pregnant during influenza season should receive inactivated influenza vaccine.    People with minor illnesses, such as a cold, may be vaccinated. People who are moderately or severely ill should usually wait until they recover before getting influenza vaccine.    Your health care provider can give you more information.    4. Risks of a vaccine reaction     Soreness, redness, and swelling where the shot is given, fever, muscle aches, and headache can happen after influenza vaccination.   There may be a very small increased risk of Guillain-Barr Syndrome (GBS) after inactivated influenza vaccine (the flu shot).    Young children who get the flu shot along with pneumococcal vaccine (PCV13) and/or DTaP vaccine at the same time might be  slightly more likely to have a seizure caused by fever. Tell your health care provider if a child who is getting flu vaccine has ever had a seizure.    People sometimes faint after medical procedures, including vaccination. Tell your provider if you feel dizzy or have vision changes or ringing in the ears.    As with any medicine, there is a very remote chance of a vaccine causing a severe allergic reaction, other serious injury, or death.    5. What if there is a serious problem?    An allergic reaction could occur after  the vaccinated person leaves the clinic. If you see signs of a severe allergic reaction (hives, swelling of the face and throat, difficulty breathing, a fast heartbeat, dizziness, or weakness), call 9-1-1 and get the person to the nearest hospital.    For other signs that concern you, call your health care provider.    Adverse reactions should be reported to the Vaccine Adverse Event Reporting System (VAERS). Your health care provider will usually file this report, or you can do it yourself. Visit the VAERS website at www.vaers.LAgents.no or call 231-148-7075. VAERS is only for reporting reactions, and VAERS staff members do not give medical advice.    6. The National Vaccine Injury Compensation Program    The [employer] Vaccine Injury Compensation Program (VICP) is a federal program that was created to compensate people who may have been injured by certain vaccines. Claims regarding alleged injury or death due to vaccination have a time limit for filing, which may be as short as two years. Visit the VICP website at SpiritualWord.at or call 660-793-5812 to learn about the program and about filing a claim.     7. How can I learn more?     Ask your health care provider.    Call your local or state health department.    Visit the website of the Food and Drug Administration (FDA) for vaccine package inserts and additional information at FinderList.no.   Contact the Centers for Disease Control and Prevention (CDC):  - Call (410) 164-6468 (1-800-CDC-INFO) or  - Visit CDC's influenza website at BiotechRoom.com.cy.    Vaccine Information Statement   Inactivated Influenza Vaccine   02/19/2023  42 [place]  [phone]     Department of Health and Insurance risk surveyor for Disease Control and Prevention    Office Use Only

## 2023-11-10 NOTE — Nursing Note (Signed)
 1. Are you 56 years of age or older? yes  2. Have you ever had a severe reaction to a flu shot? no  3. Are you allergic to latex? no  4. Are you allergic to Thimerosol? no  5. Are you experiencing acute illness symptoms or have you been running a fever? no  6. Do you have a medical condition or taking medications that suppress your immune system? no  7. Have you ever had Guillain-Barre syndrome or other neurologic disorder? no  8. Are you pregnant or breastfeeding? no    Immunization administered       Name Date Dose VIS Date Route    Influenza Vaccine, 6 month-adult 11/10/2023 0.5 mL 02/19/2023 Intramuscular    Site: Left arm    Given By: Cathe Moats, MA    Manufacturer: [employer]    Lot: [id number]    NDC: [id number]            Kellsey Sansone, MA 11/10/2023, 09:04

## 2023-11-13 ENCOUNTER — Ambulatory Visit: Attending: Obstetrics & Gynecology | Admitting: Obstetrics & Gynecology

## 2023-11-13 ENCOUNTER — Other Ambulatory Visit: Payer: Self-pay

## 2023-11-13 ENCOUNTER — Encounter (HOSPITAL_BASED_OUTPATIENT_CLINIC_OR_DEPARTMENT_OTHER): Payer: Self-pay | Admitting: Obstetrics & Gynecology

## 2023-11-13 ENCOUNTER — Encounter (INDEPENDENT_AMBULATORY_CARE_PROVIDER_SITE_OTHER): Payer: Self-pay

## 2023-11-13 VITALS — BP 130/72 | Ht 62.0 in | Wt 160.5 lb

## 2023-11-13 DIAGNOSIS — Z760 Encounter for issue of repeat prescription: Secondary | ICD-10-CM | POA: Insufficient documentation

## 2023-11-13 DIAGNOSIS — Z79890 Hormone replacement therapy (postmenopausal): Secondary | ICD-10-CM

## 2023-11-13 DIAGNOSIS — Z780 Asymptomatic menopausal state: Secondary | ICD-10-CM | POA: Insufficient documentation

## 2023-11-13 MED ORDER — MEDROXYPROGESTERONE 10 MG TABLET
10.0000 mg | ORAL_TABLET | ORAL | 11 refills | Status: AC
Start: 2023-11-13 — End: ?

## 2023-11-13 MED ORDER — ESTRADIOL 0.5 MG TABLET
0.5000 mg | ORAL_TABLET | Freq: Every day | ORAL | 0 refills | Status: AC
Start: 2023-11-13 — End: ?

## 2023-11-13 NOTE — Progress Notes (Signed)
 [place]  Ob/Gyn Outpatient Visit    Subjective:   April Cordova is a 58 y.o. [phone] female who presents for gyn HRT refill.   The patient is post menopausal on E/P HRT.  She reported improved symptoms.  The patient is sexually active.  No vaginal bleeding or discharge,   No urinary or gastrointestinal symptoms.     OB History:  OB History       Gravida   5    Para   3    Term   3    Preterm        AB   2    Living   3         SAB   2    IAB        Ectopic        Multiple        Live Births                      Gyn History:      Menarche age: 89  Menstrual History: Patient's last menstrual period was 12/20/2022., irregular, Flow is Irregular  Dysmennorhea No,  Intermenstrual bleeding No.   Contraception: none  Last Pap Smear:normal February/2021   Cervical Procedure: No  STD History: No   Sexual HX:  Sexually active, dyspareunia No  Past Medical History:  Past Medical History:   Diagnosis Date    ADD (attention deficit disorder)     Anxiety     Cancer (CMS HCC)     basal cell ca   head and shoulders    Depression     Lumbar herniated disc            Current Medications:  buPROPion  (WELLBUTRIN  XL) 150 mg extended release 24 hr tablet, Take 1 tablet by mouth once daily  cyclobenzaprine  (FLEXERIL ) 5 mg Oral Tablet, Take 1 Tablet (5 mg total) by mouth Every night as needed for Muscle spasms  estradioL  (ESTRACE ) 0.5 mg Oral Tablet, Take 1 Tablet (0.5 mg total) by mouth Daily  medroxyPROGESTERone  (PROVERA ) 10 mg Oral Tablet, Take 1 Tablet (10 mg total) by mouth Every other day    No facility-administered medications prior to visit.      Surgical History  Past Surgical History Pertinent Negatives:   Procedure Date Noted    HX APPENDECTOMY 07/09/2021    HX BLADDER REPAIR 07/09/2021    HX BLOOD TRANSFUSION 07/09/2021    HX BREAST BIOPSY 01/23/2019    HX BREAST LUMPECTOMY 01/23/2019    HX BREAST RECONSTRUCTION 01/23/2019    HX BREAST REDUCTION 01/23/2019    HX CESAREAN SECTION 07/09/2021    HX  CHOLECYSTECTOMY 07/09/2021    HX CONE BIOPSY 07/09/2021    HX CYST INCISION AND DRAINAGE 01/23/2019    HX CYSTOSCOPY 07/09/2021    HX HYSTERECTOMY 03/10/2021    HX LEEP PROCEDURE 07/09/2021    HX MASTECTOMY, SIMPLE 03/10/2021    HX OOPHORECTOMY 01/23/2019    HX TONSILLECTOMY 07/09/2021    HX TUBAL LIGATION 07/09/2021    PB POST COLPORRHAPHY,RECTUM/VAGINA 07/09/2021    PB REPAIR ENTEROCELE,VAG APPRCH 07/09/2021    PB REPR VAGINAL PROLAPSE,SACROSP LIG 07/09/2021    PB SLING OPER STRES INCONTINENCE 07/09/2021    URETERAL STENT TO GRAVITY DRAINAGE 07/09/2021             Family History:  Family Medical History:       Problem Relation (Age of Onset)  Alcohol abuse Brother    Alzheimer's/Dementia Mother    Breast Cancer Sister (35)    Cancer Father    Diabetes Mother    Drug Abuse Brother               Allergies:  Allergies[1]    Social History:  Social History[2]    Concern    Drug Use No    Marital Status: Married    Abuse/Domestic Violence No       Review of Systems:  Constitutional: negative  Respiratory: negative  Cardiovascular: negative  Gastrointestinal: negative  Genitourinary:negative  Integument/breast: negative  Musculoskeletal:negative  Neurological: negative  Behavioral/Psych: negative       Objective:   Vitals: BP 130/72   Ht 1.575 m (5' 2)   Wt 72.8 kg (160 lb 7.9 oz)   LMP 05/12/2023 (Exact Date)   BMI 29.35 kg/m       General: appears in good health and appears stated age      Assessment:   April Cordova is a 58 y.o. female [phone] who presents for GYN visit.  Healthy female exam.      ICD-10-CM    1. Medication refill  Z76.0       2. Post-menopausal  Z78.0 TESTOSTERONE, TOTAL, BIOAVAILABLE, AND FREE WITH SHBG, SERUM                Plan:   HRT refill.  All questions answered.  The patient will call with any additional questions.      Marquette DELENA Hey, MD 11/13/2023, 09:20         [1] No Known Allergies  [2]   Social History  Tobacco Use    Smoking status: Former     Current packs/day: 0.50      Average packs/day: 0.5 packs/day for 10.0 years (5.0 ttl pk-yrs)     Types: Cigarettes    Smokeless tobacco: Never    Tobacco comments:     I quit 1999   Vaping Use    Vaping status: Never Used   Substance Use Topics    Alcohol use: Yes     Alcohol/week: 4.0 standard drinks of alcohol     Types: 4 Glasses of wine per week    Drug use: Never

## 2023-11-19 NOTE — Telephone Encounter (Signed)
 When do you want her to come get her TSH drawn again?

## 2023-11-19 NOTE — Telephone Encounter (Signed)
 Medication refill request:    Last scheduled appointment: 08/25/23  Currently scheduled future appointment: Visit date not found  Last office visit in this department: 08/25/2023  Last labs:COMPLETE BLOOD COUNT   Lab Results   Component Value Date    WBC 5.0 08/25/2023    HGB 14.3 08/25/2023    HCT 41.9 08/25/2023    PLTCNT 282 08/25/2023       DIFFERENTIAL  Lab Results   Component Value Date    PMNS 55.8 08/25/2023    MONOCYTES 9.5 08/25/2023    BASOPHILS 0.8 08/25/2023    BASOPHILS <0.10 08/25/2023    PMNABS 2.81 08/25/2023    LYMPHSABS 1.62 08/25/2023    EOSABS <0.10 08/25/2023    MONOSABS 0.48 08/25/2023     ,   Comprehensive Metabolic Profile    Lab Results   Component Value Date    SODIUM 137 08/25/2023    POTASSIUM 4.4 08/25/2023    CHLORIDE 109 08/25/2023    CO2 21 (L) 08/25/2023    ANIONGAP 7 08/25/2023    BUN 13 08/25/2023    CREATININE 0.65 08/25/2023    ALBUMIN 4.0 08/25/2023    CALCIUM 8.8 08/25/2023    ALKPHOS 51 08/25/2023    ALT 38 (H) 08/25/2023    AST 29 08/25/2023    TOTBILIRUBIN 0.6 08/25/2023    TOTALPROTEIN 7.1 08/25/2023           Pharmacy:   Preferred Pharmacy       [facility] [address] [place] - [address]    [address] [place] [address]    Phone: [phone] Fax: [phone]    Hours: Not open 24 hours            Porchea Charrier Delsignore, RN  11/19/2023, 09:45

## 2023-11-24 ENCOUNTER — Ambulatory Visit (INDEPENDENT_AMBULATORY_CARE_PROVIDER_SITE_OTHER)

## 2023-11-24 ENCOUNTER — Other Ambulatory Visit (HOSPITAL_COMMUNITY)

## 2023-11-24 ENCOUNTER — Other Ambulatory Visit: Admitting: Obstetrics & Gynecology

## 2023-11-24 ENCOUNTER — Other Ambulatory Visit: Payer: Self-pay

## 2023-11-24 DIAGNOSIS — R946 Abnormal results of thyroid function studies: Secondary | ICD-10-CM

## 2023-11-24 DIAGNOSIS — Z780 Asymptomatic menopausal state: Secondary | ICD-10-CM

## 2023-11-24 LAB — THYROID STIMULATING HORMONE (SENSITIVE TSH): TSH: 1.359 u[IU]/mL (ref 0.350–4.940)

## 2023-11-24 LAB — THYROXINE, FREE (FREE T4): THYROXINE (T4), FREE: 1.05 ng/dL (ref 0.70–1.48)

## 2023-11-24 NOTE — Nursing Note (Signed)
 Department of Community Practice     Venipuncture performed in office on right arm antecubital vein, dry pressure dressing was applied to site and patient tolerated it well.  Specimen was centrifuged, aliquoted as needed and specimen was labeled and packaged for transport.    Stephane Loss, CMA  11/24/2023, 14:13

## 2023-11-25 ENCOUNTER — Ambulatory Visit (INDEPENDENT_AMBULATORY_CARE_PROVIDER_SITE_OTHER): Payer: Self-pay

## 2023-11-25 LAB — THYROPEROXIDASE (TPO) ANTIBODIES, SERUM: ANTI THYROPEROXIDASE ANTIBODIES: 7 [IU]/mL (ref <=33)

## 2023-11-29 ENCOUNTER — Ambulatory Visit (HOSPITAL_BASED_OUTPATIENT_CLINIC_OR_DEPARTMENT_OTHER): Payer: Self-pay | Admitting: Family

## 2023-11-29 ENCOUNTER — Other Ambulatory Visit (HOSPITAL_BASED_OUTPATIENT_CLINIC_OR_DEPARTMENT_OTHER): Payer: Self-pay | Admitting: Obstetrics & Gynecology

## 2023-11-29 ENCOUNTER — Ambulatory Visit (HOSPITAL_BASED_OUTPATIENT_CLINIC_OR_DEPARTMENT_OTHER): Payer: Self-pay

## 2023-11-29 DIAGNOSIS — R7989 Other specified abnormal findings of blood chemistry: Secondary | ICD-10-CM

## 2023-11-29 LAB — TESTOSTERONE, TOTAL, BIOAVAILABLE, AND FREE WITH SHBG, SERUM
ALBUMIN: 4.2 g/dL (ref 3.6–5.1)
SEX HORMONE BINDING GLOB.: 34 nmol/L (ref 14–73)
TESTOSTERONE, BIOAVAILABLE: 10.3 ng/dL — ABNORMAL HIGH (ref 0.5–8.5)
TESTOSTERONE, FREE: 5.3 pg/mL — ABNORMAL HIGH (ref 0.2–5.0)
TESTOSTERONE,TOTAL,LC/MS/MS: 46 ng/dL — ABNORMAL HIGH (ref 2–45)

## 2024-01-08 ENCOUNTER — Encounter (HOSPITAL_BASED_OUTPATIENT_CLINIC_OR_DEPARTMENT_OTHER): Payer: Self-pay | Admitting: Obstetrics & Gynecology

## 2024-01-12 ENCOUNTER — Encounter (INDEPENDENT_AMBULATORY_CARE_PROVIDER_SITE_OTHER): Payer: Self-pay

## 2024-01-12 ENCOUNTER — Telehealth (INDEPENDENT_AMBULATORY_CARE_PROVIDER_SITE_OTHER): Payer: Self-pay | Admitting: ANESTHESIOLOGY

## 2024-01-12 NOTE — Telephone Encounter (Signed)
 CM left voice message requesting a return phone call to review guidelines. A MyChart message was sent to PT about Guidelines.    01/12/2024  Toribio Molt, CASE MANAGER

## 2024-01-19 ENCOUNTER — Other Ambulatory Visit (HOSPITAL_BASED_OUTPATIENT_CLINIC_OR_DEPARTMENT_OTHER): Payer: Self-pay | Admitting: Obstetrics & Gynecology

## 2024-01-25 ENCOUNTER — Telehealth (INDEPENDENT_AMBULATORY_CARE_PROVIDER_SITE_OTHER): Payer: Self-pay | Admitting: ANESTHESIOLOGY

## 2024-01-25 NOTE — Telephone Encounter (Signed)
 CM contacted PT and went over Non Fasting guidelines for upcoming procedure on 01.07.26 with Dr Woodie. PT states they are not currently taking antibiotics. Pt was instructed to inform the [facility] (Pain Clinic) if they start any antibiotics. Between their guideline review date and  up to and including their appt date to notify staff ASAP. Because their appt may be canceled or rescheduled. PT had no further questions at this time Clinic phone number was provided to PT at this time.    01/25/2024  Toribio Molt, CASE MANAGER

## 2024-01-26 ENCOUNTER — Encounter (INDEPENDENT_AMBULATORY_CARE_PROVIDER_SITE_OTHER): Payer: Self-pay | Admitting: ANESTHESIOLOGY

## 2024-01-26 ENCOUNTER — Other Ambulatory Visit: Payer: Self-pay

## 2024-01-26 ENCOUNTER — Ambulatory Visit: Payer: Self-pay | Attending: ANESTHESIOLOGY | Admitting: ANESTHESIOLOGY

## 2024-01-26 VITALS — Temp 96.8°F | Wt 160.0 lb

## 2024-01-26 DIAGNOSIS — M545 Low back pain, unspecified: Secondary | ICD-10-CM | POA: Insufficient documentation

## 2024-01-26 DIAGNOSIS — M5116 Intervertebral disc disorders with radiculopathy, lumbar region: Secondary | ICD-10-CM | POA: Insufficient documentation

## 2024-01-26 DIAGNOSIS — M5126 Other intervertebral disc displacement, lumbar region: Secondary | ICD-10-CM | POA: Insufficient documentation

## 2024-01-26 DIAGNOSIS — M5416 Radiculopathy, lumbar region: Secondary | ICD-10-CM | POA: Insufficient documentation

## 2024-01-26 DIAGNOSIS — M5417 Radiculopathy, lumbosacral region: Secondary | ICD-10-CM | POA: Insufficient documentation

## 2024-01-26 NOTE — Patient Instructions (Signed)
 [facility]  [address]  [place] [address]  Dept: [phone]  Dept Fax: [phone]  [phone]                                                 SPECIAL PROCEDURES                                     DISCHARGE FORM                                          [phone]      Please follow the instructions listed below for your procedures.  If you have questions concerning your procedure, you may call and leave a message.  Messages will be returned by the end of the next business day.  If you have an emergency, proceed to your local Emergency Department.      PROCEDURE: LEFT TRANSFORAMINAL EPIDURAL STEROID INJECTION    Do not drive a car or operate machinery until tomorrow.  Rest today and return to normal activities tomorrow.  If you are on restricted activities by your physician, please continue to follow these.  If you are not sure, contact your physician.  It is possible to experience mild numbness of the lower back and legs.  This is temporary.  If you have soreness at the injection site, the application of heat or ice may be helpful. Mild analgesics may also be used.  In case of severe headache; lie flat to decrease it.  Increase all fluids especially those with caffeine.  Mild analgesics are also appropriate.  If headache is not relieved by these measures, contact the Pain Clinic.  Steroid injections may cause temporary increase of blood sugar levels.    These instructions have been reviewed with the patient and appropriate questions have answers.  Levon Croft, Ambulatory Care Assistant 01/26/2024 14:53

## 2024-01-26 NOTE — Procedures (Signed)
 PA[facility] MA[facility]  [address]  [facility] [place] [address]  Operated by [facility]  Procedure Note    Name: Tecora Eustache MRN:  [id number]   Date: 01/26/2024 DOB:  1965/01/27 (59 y.o.)         [address] - INJECT DIAGN/THERAP SUBSTANCE W/ IMAGE GUIDANCE, INTERLAMINAR EPIDURAL/SUBARACHNOID, LUMBAR/SACRAL (AMB ONLY)    Performed by: Woodie Katz, MD  Authorized by: Woodie Katz, MD    Time Out:     Immediately before the procedure, a time out was called:  Yes    Patient verified:  Yes    Procedure Verified:  Yes    Site Verified:  Yes    Lumbar Transforaminal Epidural Steroid Injection with Fluoro    Pre Procedure Dx:  Lumbar radiculopathy    Post Procedure Dx:  Lumbar radiculopathy    Guidance:  Fluoroscopy    Level: L5/S1    Side: Left    Skin: 3cc 1% lidocaine    Contrast: 3cc M200 Isovue    Injectate: 10 mg dexamethasone , 2cc 0.2% ropivacaine     Consent and timeout were done.  Patient was placed in the prone position.  Skin was prepped and draped in the routine fashion and sterile technique was maintained throughout.    In 30 degrees of ipsilateral oblique the fluoroscope was used to identify the neuroforamen  at:   Left L5                Overlying skin was anesthetized.  Then, a 22 gauge spinal needle was advanced until trajectory was established toward the superior foramen.  Next, a lateral view was obtained and the needle was advanced until it reached the superior/anterior margin of the foramen.  After negative aspiration, contrast was injected under live fluoroscopy in both AP and lateral views.  Characteristic epidural spread was noted.  No vascular spread was noted.  After repeat negative aspiration the above solution was injected.  Patient tolerated this very well.  Patient returned to the recovery area with no complaints or apparent complications. Fluoroscopy images have been saved to the PACS system.     Katz Woodie, MD

## 2024-01-26 NOTE — Nursing Note (Signed)
 [place] Pain Rating Scale     On a scale of 0-10, during the past 24 hours, pain has interfered with you usual activity: 2     On a scale of 0-10, during the past 24 hours, pain has interfered with your sleep: 5    On a scale of 0-10, during the past 24 hours, pain has affected your mood: 3     On a scale of 0-10, during the past 24 hours, pain has contributed to your stress: 2     On a scale of 0-10, what is your overall pain Rating: 4

## 2024-01-27 ENCOUNTER — Ambulatory Visit (INDEPENDENT_AMBULATORY_CARE_PROVIDER_SITE_OTHER): Payer: Self-pay

## 2024-02-11 ENCOUNTER — Other Ambulatory Visit: Payer: Self-pay

## 2024-02-11 ENCOUNTER — Ambulatory Visit: Payer: Self-pay

## 2024-02-11 ENCOUNTER — Encounter (INDEPENDENT_AMBULATORY_CARE_PROVIDER_SITE_OTHER): Payer: Self-pay

## 2024-02-11 VITALS — BP 105/72 | HR 73 | Temp 97.0°F | Resp 20 | Ht 62.0 in | Wt 163.0 lb

## 2024-02-11 DIAGNOSIS — R200 Anesthesia of skin: Secondary | ICD-10-CM | POA: Insufficient documentation

## 2024-02-11 DIAGNOSIS — R202 Paresthesia of skin: Secondary | ICD-10-CM

## 2024-02-11 DIAGNOSIS — M5416 Radiculopathy, lumbar region: Secondary | ICD-10-CM | POA: Insufficient documentation

## 2024-02-11 DIAGNOSIS — M545 Low back pain, unspecified: Secondary | ICD-10-CM | POA: Insufficient documentation

## 2024-02-11 NOTE — Progress Notes (Signed)
 [facility]  [address]  [place], [place] [address]  [phone]    Progress Note    April Cordova  MRN: [id number]  DOB: 14-Apr-1965  Date of Service: 02/11/2024    CHIEF COMPLAINT  Chief Complaint   Patient presents with    Back Pain       SUBJECTIVE  April Cordova is a 59 y.o. female who presents to clinic for procedure follow up. Patient had left L5 TFESI performed on 01/26/24. Patient endorses 75% relief 1 week(s). Denies any side effects or complication with the procedure. Had injection in September that helped with pain in her back and the tingling and numbness in her left leg. Pain and leg symptoms started to return in December. Had the injection recently which did help the numbness and tingling for a week but then gradually return. Her symptoms in her leg are now what they were compared to prior to her injection. Previously tried gabapentin  and that caused significant sedation and a brain fog. She states she can handle her current symptoms and wants to wait to see how her pain evolves prior to another intervention.     She also has neck pain that she sees Dr. Elza for. She does have tingling in her arms at night but the most bothersome issue is neck stiffness and pain. Has not tried CMBB's for this. Discussed receiving massage therapy on a regular basis to help with myofascial dysfunction.     TREATMENT HISTORY:      Helpful Not Helpful Not Tried  Comments   MBB       RFA/Cryoablation       Epidural       Transforaminal/  Nerve root block  X   01/26/24 left L5 75% 1 week  10/06/23 left L5 75% 3 months   SI injection       Intraarticular Joint        Sympathetic Block       Other        TPI       GTB        SCS       Pain pump         Physical Therapy or HEP  X   Recent PT: current back HEP   Massage Therapy       Acupuncture        Chiropractor       Exercise Physiologist        Counseling/CBT         Medications:   buPROPion  (WELLBUTRIN  XL) 150 mg extended release 24 hr  tablet, Take 1 tablet by mouth once daily  cyclobenzaprine  (FLEXERIL ) 5 mg Oral Tablet, Take 1 Tablet (5 mg total) by mouth Every night as needed for Muscle spasms  estradioL  (ESTRACE ) 0.5 mg Oral Tablet, Take 1 Tablet (0.5 mg total) by mouth Daily  medroxyPROGESTERone  (PROVERA ) 10 mg Oral Tablet, Take 1 Tablet (10 mg total) by mouth Every other day    No facility-administered medications prior to visit.      Allergies:   Allergies[1]    Review of Systems:   CONSTITUTIONAL: Denies weight change. Denies fever.   GI: Denies bowel incontinence.  GU: Denies urinary incontinence or retention.  NEUROLOGICAL: Denies paralysis. Denies tremors    PSYCHIATRIC: Denies acute changes in mood.  MUSCULOSKELETAL: See HPI.    OBJECTIVE  BP 105/72   Pulse 73   Temp 36.1 C (97 F)   Resp  20   Ht 1.575 m (5' 2)   Wt 73.9 kg (163 lb)   SpO2 98%   BMI 29.81 kg/m           General: no distress   Abdomen: soft, non-tender and non-distended   Respiratory: No increased work of breathing. No increased accessory muscle use   Cardiovascular: acyanotic, no JVD  Skin: warm and dry, no rash  Neurologic: gait is normal , motor 5/5 and sensory intact in b/l UEs, AOx3, CN 2-12 grossly intact, negative Hoffman's b/l  Deep Tendon Reflexes    Brachioradialis Bicep Achilles   Right  2+ 2+ 2+   Left 2+ 2+ 2+     Psychiatric: normal affect and behavior  Musculoskeletal: no cyanosis or edema  Lumbar   ROM: Preserved   Palpation: Non-tender   Motor: 5/5 in LEs b/l   Sensory: Intact in LEs b/l   Facet loading: Negative bilaterally    Facet TTP: Negative bilaterally    Trigger points:  Negative   Straight leg raise:  Negative bilaterally      SI Joint   Palpation: Non-tender bilaterally       Nursing Notes:   Taunya Frieze, Ambulatory Care Assistant  02/11/24 0916  Signed  Procedure Follow Up  Candis Staple  [id number]  Chief Complaint   Patient presents with    Back Pain       [place] Pain Rating Scale     On a scale of 0-10, during the past 24 hours, pain  has interfered with you usual activity: 1     On a scale of 0-10, during the past 24 hours, pain has interfered with your sleep: 3    On a scale of 0-10, during the past 24 hours, pain has affected your mood: 0     On a scale of 0-10, during the past 24 hours, pain has contributed to your stress: 1     On a scale of 0-10, what is your overall pain Rating: 2      Vitals:    02/11/24 0912   BP: 105/72   Pulse: 73   Resp: 20   Temp: 36.1 C (97 F)   SpO2: 98%   Weight: 73.9 kg (163 lb)   Height: 1.575 m (5' 2)   PainSc:   2   PainLoc: Back     Body mass index is 29.81 kg/m.    New imaging:    Patient is here today S/ P TFESI that was done on 01/26/24 and reports 10 % relief that lasted .    Erica Reckart, Ambulatory Care Assistant  02/11/2024, 09:14       Imaging: Reviewed                  ASSESSMENT    ICD-10-CM    1. Lumbar radiculopathy  M54.16       2. Low back pain  M54.50       3. Left leg numbness  R20.0           PLAN/RECOMMENDATION  No orders of the defined types were placed in this encounter.      1. Repeat left L5 TFESI PRN   Can repeat on or after 04/25/24   We discussed spacing these out as long as possible so these help her for long periods of time   2. Discussed CMBB's for her neck, will discuss with Dr. Elza   3. Recommend a regular massage for myofascial dysfunction  in her neck   4. Wants to hold off on medications for now.   5. Follow up PRN or when pain worsens     Our impression, treatment recommendations and plan from today's visit were reviewed in detail with the patient in the office. All of the patients questions were answered. If a procedure was ordered, it was explained using a spine model,  including technique, benefits, alternatives and the associated risks.   The patient verbalized understanding of the above plan and the patient wishes to move forward with the above noted plan.    Delon Marble, PA-C 02/11/2024, 09:55         [1] No Known Allergies

## 2024-02-11 NOTE — Nursing Note (Signed)
 Procedure Follow Up  Shaynna Husby  [id number]  Chief Complaint   Patient presents with    Back Pain       [place] Pain Rating Scale     On a scale of 0-10, during the past 24 hours, pain has interfered with you usual activity: 1     On a scale of 0-10, during the past 24 hours, pain has interfered with your sleep: 3    On a scale of 0-10, during the past 24 hours, pain has affected your mood: 0     On a scale of 0-10, during the past 24 hours, pain has contributed to your stress: 1     On a scale of 0-10, what is your overall pain Rating: 2      Vitals:    02/11/24 0912   BP: 105/72   Pulse: 73   Resp: 20   Temp: 36.1 C (97 F)   SpO2: 98%   Weight: 73.9 kg (163 lb)   Height: 1.575 m (5' 2)   PainSc:   2   PainLoc: Back     Body mass index is 29.81 kg/m.    New imaging:    Patient is here today S/ P TFESI that was done on 01/26/24 and reports 10 % relief that lasted .    Arrabella Westerman, Ambulatory Care Assistant  02/11/2024, 09:14

## 2024-02-17 ENCOUNTER — Other Ambulatory Visit: Payer: Self-pay

## 2024-02-17 ENCOUNTER — Encounter (INDEPENDENT_AMBULATORY_CARE_PROVIDER_SITE_OTHER): Payer: Self-pay

## 2024-02-17 ENCOUNTER — Ambulatory Visit

## 2024-02-17 DIAGNOSIS — M7918 Myalgia, other site: Secondary | ICD-10-CM | POA: Insufficient documentation

## 2024-02-17 DIAGNOSIS — M4802 Spinal stenosis, cervical region: Secondary | ICD-10-CM | POA: Insufficient documentation

## 2024-02-17 DIAGNOSIS — G959 Disease of spinal cord, unspecified: Secondary | ICD-10-CM | POA: Insufficient documentation

## 2024-02-17 NOTE — Progress Notes (Signed)
 02/17/2024     Modified [place] [employer] Score (mJOA)    Motor dysfunction, upper extremities   0 - Inability to move hands   1 - Inability to eat with spoon, but able to move hands   2 - Inability to button shirt, but able to eat with spoon   3 - Able to button shirt with great difficulty   4 - Able to button shirt with slight difficulty   5 - No dysfunction    Motor dysfunction, lower extremities   0 - Complete loss of motor & sensory function   1 - Sensory preservation without ability to move legs   2 - Able to move legs, but unable to walk   3 - Able to walk on flat floor, with a walking aid (cane, crutch)   4 - Able to walk up and/or down stairs with hand rail   5 - Moderate to significant instability, but able to walk up and/or down stairs without hand rail   6 - Mild lack of stability but walks with smooth reciprocation unaided   7 - No Dysfunction    Sensory dysfunction, upper extremities   0 - Complete loss of hand sensation   1 - Severe sensory loss or pain   2 - Mild sensory loss   3 - No sensory loss    Sphincter dysfunction   0 - Inability to micturate voluntarily   1 - Marked difficulty with micturition   2 - Mild-moderate difficulty with micturition   3 - Normal micturition    Total mJOA: 15    (15-17 Mild, 12-14 Moderate, 0-11 Severe)

## 2024-02-17 NOTE — Progress Notes (Signed)
 [place]  [facility]                              [facility]  [address]  [place] [address]  [phone]              Date of Service: 02/17/2024  Name: April Cordova  Date of Birth: 08/05/1965    Low Back Pain    SUBJECTIVE: April Cordova is a 59 y.o. woman who presents for cervical myelopathy check.  She initially presented for low back and left leg pain.  She has been getting left L5-S1 transforaminal epidural steroid injections with the pain clinic.  She feels that this is very helpful to her pain.  She does note some tailbone pain on occasion with sitting but this seems to resolve after she standing.  In regards to her neck and myelopathic symptoms, she does continue to endorse that she is dropping items.  She endorses some issues with dexterity such as putting on jewelry or buttoning a button.  She also feels that she has some weakness of grip whenever opening things.  She was noting these issues last time, but she does feel that they have progressive over the past 6 months to a year.  She denies any urinary frequency urgency or retention.  She feels that she has a bit of incontinence after urinating.  She feels that this has been stable for a long period of time.  She denies any balance issues.  She has a skier and feels that she is quite steady when doing that.  She has also endorsing some axial neck pain.  She also states that it is present to her periscapular area.  She states that she does have some muscle relaxants and try these.  She feels that they do help to break the cycle of pain.  She denies any radiation into her arms.  She does note occasional paresthesias to her middle and ring finger on the dorsal aspect.  This is not constant and does not radiate into the fingers.  She states that she has active in his established with someone who is helping her with an exercise program.  She does feel that she gets pain whenever she is doing weights with her arms or whenever she  is hunching forward.    PHYSICAL EXAMINATION:   General Assessment: Patient is alert and oriented and in no signs of distress.  Respiratory: No increased work of breathing or plainly audible adventitious sounds.  Cardiovascular:  2+ Peripheral pulses.   Skin: clear/intact  Neurologic: alert and oriented x3  Musculoskeletal:   Gait and Station:normal gait and station  Extremities: no cyanosis, clubbing or edema present    Sensation:   Sensation to light touch intact throughout all extremities.    Manual Muscle Grading:   D B WE T FF HI HF Q TA EHL G   RIGHT 5/5  5/5  5/5  5/5  5/5  5/5  5/5  5/5  5/5  5/5  5/5    LEFT 5/5  5/5  5/5  5/5  5/5  5/5  5/5  5/5  5/5  5/5  5/5      Reflexes:   RIGHT LEFT   BICEPS 2+ 2+   BRACHIORADIALIS 2+ 2+   TRICEPS 2+ 2+   PATELLAR 3+ 3+   ACHILLES 2+ 2+     Hoffman's Reflex: Left: positive Right: negative  Clonus: Left: negative Right: negative    IMAGING  Independent review of imaging on 02/17/2024   No new imaging obtained today    ASSESSMENT AND PLAN   Patient is a 59 y.o. female for cervical myelopathy check.  Her MJOA15 today.  However, she is noting progression of symptoms over the past 6-12 months.  She is reporting neck pain primarily to the posterior lateral aspect of her neck that radiates to the periscapular area.  I will give her a prescription for physical therapy.  Discussed no manipulation retraction.  Order a dynamic MRI further evaluation of her stenosis.  She will return to clinic after MRI to discuss with Dr. Elza.  We discussed avoidance of high fall risk activities in no neck manipulation.    I have answered all questions and the patient is pleased with the plan of treatment. If the patient has any questions or concerns, the patient can contact us .   I have seen this patient independently and discussed with the cosigning physician present.  The date and time stamp below reflect the action of opening and starting the note and do not reflect the visit start time, end  time or visit length.    Brittney Stimac, APRN, CNP 02/17/2024    Case and imaging reviewed and discussed with APP listed above. Patient seen independently by APP.     Onia Shiflett E. Elza, MD

## 2024-02-21 ENCOUNTER — Other Ambulatory Visit (INDEPENDENT_AMBULATORY_CARE_PROVIDER_SITE_OTHER): Payer: Self-pay | Admitting: NURSE PRACTITIONER

## 2024-02-21 MED ORDER — BUPROPION HCL XL 150 MG 24 HR TABLET, EXTENDED RELEASE: 150.0000 mg | ORAL_TABLET | Freq: Every day | ORAL | 0 refills | Status: AC

## 2024-02-22 ENCOUNTER — Encounter (HOSPITAL_COMMUNITY): Payer: Self-pay

## 2024-02-23 ENCOUNTER — Encounter (INDEPENDENT_AMBULATORY_CARE_PROVIDER_SITE_OTHER): Payer: Self-pay | Admitting: Physician Assistant

## 2024-02-24 ENCOUNTER — Other Ambulatory Visit (INDEPENDENT_AMBULATORY_CARE_PROVIDER_SITE_OTHER): Payer: Self-pay | Admitting: Physician Assistant

## 2024-02-24 ENCOUNTER — Other Ambulatory Visit: Payer: Self-pay

## 2024-02-24 MED ORDER — DIAZEPAM 5 MG TABLET: ORAL_TABLET | ORAL | 0 refills | Status: AC

## 2024-02-25 ENCOUNTER — Ambulatory Visit
